# Patient Record
Sex: Male | Born: 1995 | Race: Black or African American | Hispanic: No | Marital: Single | Smoking: Never smoker
Health system: Southern US, Community
[De-identification: ages and names within clinical notes are randomized; demographics above are authoritative.]

## PROBLEM LIST (undated history)

## (undated) DIAGNOSIS — R569 Unspecified convulsions: Secondary | ICD-10-CM

## (undated) DIAGNOSIS — G809 Cerebral palsy, unspecified: Secondary | ICD-10-CM

## (undated) DIAGNOSIS — F909 Attention-deficit hyperactivity disorder, unspecified type: Secondary | ICD-10-CM

## (undated) HISTORY — PX: HIP PINNING: SHX1757

---
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
## 2013-03-18 DIAGNOSIS — R569 Unspecified convulsions: Secondary | ICD-10-CM

## 2016-04-19 DIAGNOSIS — G809 Cerebral palsy, unspecified: Secondary | ICD-10-CM | POA: Insufficient documentation

## 2016-04-28 DIAGNOSIS — D709 Neutropenia, unspecified: Secondary | ICD-10-CM | POA: Insufficient documentation

## 2016-10-19 DIAGNOSIS — Q6589 Other specified congenital deformities of hip: Secondary | ICD-10-CM | POA: Insufficient documentation

## 2017-01-22 ENCOUNTER — Emergency Department (HOSPITAL_COMMUNITY)
Admission: EM | Admit: 2017-01-22 | Discharge: 2017-01-22 | Disposition: A | Discharge status: Home / Self Care | Payer: Medicaid Other | Attending: Emergency Medicine | Admitting: Emergency Medicine

## 2017-01-22 ENCOUNTER — Encounter (HOSPITAL_COMMUNITY): Payer: Self-pay | Admitting: *Deleted

## 2017-01-22 ENCOUNTER — Emergency Department (HOSPITAL_COMMUNITY): Payer: Medicaid Other

## 2017-01-22 DIAGNOSIS — Y929 Unspecified place or not applicable: Secondary | ICD-10-CM | POA: Diagnosis not present

## 2017-01-22 DIAGNOSIS — Y939 Activity, unspecified: Secondary | ICD-10-CM | POA: Diagnosis not present

## 2017-01-22 DIAGNOSIS — W19XXXA Unspecified fall, initial encounter: Secondary | ICD-10-CM | POA: Insufficient documentation

## 2017-01-22 DIAGNOSIS — G809 Cerebral palsy, unspecified: Secondary | ICD-10-CM | POA: Diagnosis not present

## 2017-01-22 DIAGNOSIS — Y999 Unspecified external cause status: Secondary | ICD-10-CM | POA: Diagnosis not present

## 2017-01-22 DIAGNOSIS — R569 Unspecified convulsions: Secondary | ICD-10-CM | POA: Insufficient documentation

## 2017-01-22 DIAGNOSIS — F909 Attention-deficit hyperactivity disorder, unspecified type: Secondary | ICD-10-CM | POA: Insufficient documentation

## 2017-01-22 HISTORY — DX: Cerebral palsy, unspecified: G80.9

## 2017-01-22 HISTORY — DX: Attention-deficit hyperactivity disorder, unspecified type: F90.9

## 2017-01-22 HISTORY — DX: Unspecified convulsions: R56.9

## 2017-01-22 MED ORDER — LEVETIRACETAM 500 MG PO TABS
1000.0000 mg | ORAL_TABLET | Freq: Once | ORAL | Status: AC
  Administered 2017-01-22: 1000 mg via ORAL
  Filled 2017-01-22: qty 2

## 2017-01-22 MED ORDER — OXYCODONE HCL 5 MG PO TABS: 5.0000 mg | ORAL_TABLET | Freq: Four times a day (QID) | ORAL | 0 refills | Status: DC | PRN

## 2017-01-22 NOTE — ED Notes (Signed)
Patient presents to ed via [facility]  Was getting out of the car and had a seizure family states he hit his head on the concrete. No abrasion noted to head or face. Mother is at bedside. And states patient has been taking medicines at he is suppose to. Patient has abrasion left hand. Last seizure was May 17. States he just recently had left hip surgery 4/10. Scheduled for follow up with his neur. Doctor at [facility] 6/21. Patient is a/o .

## 2017-01-22 NOTE — Discharge Instructions (Signed)
Please read and follow all provided instructions.  Your diagnoses today include:  1. Seizure (HCC)     Tests performed today include: Vital signs. See below for your results today.   Medications prescribed:  Take as prescribed   Home care instructions:  Follow any educational materials contained in this packet.  Follow-up instructions: Please follow-up with your Neurologist for further evaluation of symptoms and treatment   Return instructions:  Please return to the Emergency Department if you do not get better, if you get worse, or new symptoms OR  - Fever (temperature greater than 101.55F)  - Bleeding that does not stop with holding pressure to the area    -Severe pain (please note that you may be more sore the day after your accident)  - Chest Pain  - Difficulty breathing  - Severe nausea or vomiting  - Inability to tolerate food and liquids  - Passing out  - Skin becoming red around your wounds  - Change in mental status (confusion or lethargy)  - New numbness or weakness    Please return if you have any other emergent concerns.  Additional Information:  Your vital signs today were: BP (!) 138/53 (BP Location: Right Arm)    Pulse (!) 106    Temp 97.6 F (36.4 C) (Oral)    Resp 18    Ht 5\' 6"  (1.676 m)    Wt 64.9 kg (143 lb)    SpO2 99%    BMI 23.08 kg/m  If your blood pressure (BP) was elevated above 135/85 this visit, please have this repeated by your doctor within one month. ---------------

## 2017-01-22 NOTE — ED Provider Notes (Signed)
[facility]-EMERGENCY DEPT Provider Note   CSN: [id number] Arrival date & time: 01/22/17  1108     History   Chief Complaint Chief Complaint  Patient presents with  . Seizures    HPI Roberto Manning is a 21 y.o. male.  HPI  21 y.o. male with a hx of Seizures, Cerebral Palsy, presents to the Emergency Department today due to seizure PTA around 10a. Noted hx same. Last seizure May 17th. Pt mother states that the seizures was witnessed by sister and lasted <1 minute. Noted tonic-clonic seizure as past. No aura. Postictal afterwards. Baseline currently. No N/V. No headaches. No numbness/tingling. No CP/SOB/ABD pain. No visual changes. No fevers. Pt has follow up with [facility] later this month. Pt currently taking Tegretol. Unsure of dosing. No other symptoms noted.  Past Medical History:  Diagnosis Date  . ADHD   . Cerebral palsy (HCC)   . Seizures (HCC)     There are no active problems to display for this patient.   Past Surgical History:  Procedure Laterality Date  . HIP PINNING         Home Medications    Prior to Admission medications   Not on File    Family History No family history on file.  Social History Social History  Substance Use Topics  . Smoking status: Never Smoker  . Smokeless tobacco: Never Used  . Alcohol use No     Allergies   Patient has no allergy information on record.   Review of Systems Review of Systems ROS reviewed and all are negative for acute change except as noted in the HPI.  Physical Exam Updated Vital Signs BP (!) 138/53 (BP Location: Right Arm)   Pulse (!) 106   Temp 97.6 F (36.4 C) (Oral)   Resp 18   Ht 5\' 6"  (1.676 m)   Wt 64.9 kg (143 lb)   SpO2 99%   BMI 23.08 kg/m   Physical Exam  Constitutional: He is oriented to person, place, and time. Vital signs are normal. He appears well-developed and well-nourished.  HENT:  Head: Normocephalic and atraumatic.  Right Ear: Hearing normal.  Left Ear: Hearing  normal.  Eyes: Conjunctivae and EOM are normal. Pupils are equal, round, and reactive to light.  Neck: Normal range of motion. Neck supple.  Cardiovascular: Normal rate, regular rhythm, normal heart sounds and intact distal pulses.   Pulmonary/Chest: Effort normal and breath sounds normal.  Abdominal: There is no tenderness.  Musculoskeletal: Normal range of motion.  Left Hip ROM intact. Non TTP. NVI. Distal pulses appreciated. No palpable or visible deformities  Neurological: He is alert and oriented to person, place, and time. He has normal strength. No cranial nerve deficit or sensory deficit.  Cranial Nerves:  II: Pupils equal, round, reactive to light III,IV, VI: ptosis not present, extra-ocular motions intact bilaterally  V,VII: smile symmetric, facial light touch sensation equal VIII: hearing grossly normal bilaterally  IX,X: midline uvula rise  XI: bilateral shoulder shrug equal and strong XII: midline tongue extension  Skin: Skin is warm and dry.  Psychiatric: He has a normal mood and affect. His speech is normal and behavior is normal. Thought content normal.  Nursing note and vitals reviewed.  ED Treatments / Results  Labs (all labs ordered are listed, but only abnormal results are displayed) Labs Reviewed - No data to display  EKG  EKG Interpretation None       Radiology Ct Head Wo Contrast  Result Date: 01/22/2017  CLINICAL DATA:  Seizure with fall EXAM: CT HEAD WITHOUT CONTRAST TECHNIQUE: Contiguous axial images were obtained from the base of the skull through the vertex without intravenous contrast. COMPARISON:  None. FINDINGS: Brain: The ventricles are upper normal in size for age. Sulci appear normal. There is no intracranial mass, hemorrhage, extra-axial fluid collection, or midline shift. The gray-white compartments appear normal. No acute infarct evident. Vascular: There is no hyperdense vessel. No vascular calcification evident. Skull: The bony calvarium  appears intact. Sinuses/Orbits: There is opacification in both maxillary antra, more severe on the left than on the right. Opacification of multiple ethmoid air cells bilaterally. There are retention cysts in the left sphenoid sinus region. Frontal sinuses are hypoplastic. Visualized orbits appear symmetric bilaterally. Other: Mastoid air cells are clear. IMPRESSION: Multifocal paranasal sinus disease. Ventricles upper normal in size. No intracranial mass, hemorrhage, or extra-axial fluid collection. Gray-white compartments appear normal. Electronically Signed   By: Bretta BangWilliam  Woodruff III M.D.   On: 01/22/2017 13:38    Procedures Procedures (including critical care time)  Medications Ordered in ED Medications - No data to display   Initial Impression / Assessment and Plan / ED Course  I have reviewed the triage vital signs and the nursing notes.  Pertinent labs & imaging results that were available during my care of the patient were reviewed by me and considered in my medical decision making (see chart for details).  Final Clinical Impressions(s) / ED Diagnoses   {I have reviewed and evaluated the relevant imaging studies.  {I have reviewed the relevant previous healthcare records.  {I obtained HPI from historian. {Patient discussed with supervising physician.  ED Course:  Assessment: Pt is a 21 y.o. male with hx Seizures, Cerebral Palsy who presents with seizure PTA. Hx same. Last seizure May 17th. [facility] with appointment this month. Seizure <1 min. Witnessed. Tonic-clonic. On exam, pt in NAD. Nontoxic/nonseptic appearing. VSS. Afebrile. Lungs CTA. Heart RRR. Baseline per mother currently. Pt mother requested head CT, which was unremarkable for acute abnormalities. Given Keppra loading dose in ED. Pt also complaitned of pain to left hip. Noted surgery recently. Able to bear weight. Exam unremarkable. I have reviewed the [place] Controlled Substance Reporting System. Will Rx  #7 Oxycodone for breakthrough pain. Continue Motrin/Tylenol.  Plan is to DC home with follow up to Neurology as outpatient. At time of discharge, Patient is in no acute distress. Vital Signs are stable. Patient is able to ambulate. Patient able to tolerate PO.   Disposition/Plan:  DC Home Additional Verbal discharge instructions given and discussed with patient.  Pt Instructed to f/u with Neurology in the next week for evaluation and treatment of symptoms. Return precautions given Pt acknowledges and agrees with plan  Supervising Physician Shaune PollackIsaacs, Cameron, MD  Final diagnoses:  Seizure [facility](HCC)    New Prescriptions New Prescriptions   No medications on file       Audry PiliMohr, Can Lucci, Cordelia Poche-C 01/22/17 1452    Shaune PollackIsaacs, Cameron, MD 01/23/17 1324    Shaune PollackIsaacs, Cameron, MD 01/23/17 1325

## 2017-06-06 ENCOUNTER — Observation Stay (HOSPITAL_COMMUNITY)
Admission: EM | Admit: 2017-06-06 | Discharge: 2017-06-07 | Disposition: A | Discharge status: Home / Self Care | Payer: Medicaid Other | Attending: Internal Medicine | Admitting: Internal Medicine

## 2017-06-06 ENCOUNTER — Emergency Department (HOSPITAL_COMMUNITY): Payer: Medicaid Other

## 2017-06-06 ENCOUNTER — Encounter (HOSPITAL_COMMUNITY): Payer: Self-pay | Admitting: Internal Medicine

## 2017-06-06 DIAGNOSIS — Q6589 Other specified congenital deformities of hip: Secondary | ICD-10-CM | POA: Insufficient documentation

## 2017-06-06 DIAGNOSIS — F909 Attention-deficit hyperactivity disorder, unspecified type: Secondary | ICD-10-CM | POA: Diagnosis not present

## 2017-06-06 DIAGNOSIS — M4802 Spinal stenosis, cervical region: Secondary | ICD-10-CM | POA: Diagnosis not present

## 2017-06-06 DIAGNOSIS — Z79899 Other long term (current) drug therapy: Secondary | ICD-10-CM | POA: Diagnosis not present

## 2017-06-06 DIAGNOSIS — M50822 Other cervical disc disorders at C5-C6 level: Secondary | ICD-10-CM | POA: Insufficient documentation

## 2017-06-06 DIAGNOSIS — R569 Unspecified convulsions: Principal | ICD-10-CM

## 2017-06-06 DIAGNOSIS — G809 Cerebral palsy, unspecified: Secondary | ICD-10-CM | POA: Diagnosis not present

## 2017-06-06 DIAGNOSIS — R937 Abnormal findings on diagnostic imaging of other parts of musculoskeletal system: Secondary | ICD-10-CM

## 2017-06-06 LAB — BASIC METABOLIC PANEL
Anion gap: 11 (ref 5–15)
BUN: 16 mg/dL (ref 6–20)
CO2: 25 mmol/L (ref 22–32)
Calcium: 9.5 mg/dL (ref 8.9–10.3)
Chloride: 106 mmol/L (ref 101–111)
Creatinine, Ser: 0.79 mg/dL (ref 0.61–1.24)
GFR calc Af Amer: >60 mL/min (ref >60)
GFR calc non Af Amer: >60 mL/min (ref >60)
Glucose, Bld: 90 mg/dL (ref 65–99)
POTASSIUM: 4.2 mmol/L (ref 3.5–5.1)
Sodium: 142 mmol/L (ref 135–145)

## 2017-06-06 LAB — CBC WITH DIFFERENTIAL/PLATELET
Basophils Absolute: 0 10*3/uL (ref 0.0–0.1)
Basophils Relative: 0 %
EOS ABS: 0.2 10*3/uL (ref 0.0–0.7)
Eosinophils Relative: 4 %
HCT: 43.3 % (ref 39.0–52.0)
HEMOGLOBIN: 14.5 g/dL (ref 13.0–17.0)
LYMPHS ABS: 1.5 10*3/uL (ref 0.7–4.0)
LYMPHS PCT: 33 %
MCH: 31.7 pg (ref 26.0–34.0)
MCHC: 33.5 g/dL (ref 30.0–36.0)
MCV: 94.5 fL (ref 78.0–100.0)
MONO ABS: 0.5 10*3/uL (ref 0.1–1.0)
MONOS PCT: 11 %
NEUTROS ABS: 2.3 10*3/uL (ref 1.7–7.7)
Neutrophils Relative %: 52 %
PLATELETS: 204 10*3/uL (ref 150–400)
RBC: 4.58 MIL/uL (ref 4.22–5.81)
RDW: 14.7 % (ref 11.5–15.5)
WBC: 4.4 10*3/uL (ref 4.0–10.5)

## 2017-06-06 LAB — CBG MONITORING, ED: Glucose-Capillary: 88 mg/dL (ref 65–99)

## 2017-06-06 LAB — CARBAMAZEPINE LEVEL, TOTAL

## 2017-06-06 MED ORDER — LORAZEPAM 2 MG/ML IJ SOLN: 4.0000 mg | INTRAMUSCULAR | Status: AC

## 2017-06-06 MED ORDER — SODIUM CHLORIDE 0.9 % IV SOLN
500.0000 mg | Freq: Two times a day (BID) | INTRAVENOUS | Status: DC
  Administered 2017-06-07: 500 mg via INTRAVENOUS
  Filled 2017-06-06 (×2): qty 5

## 2017-06-06 MED ORDER — SODIUM CHLORIDE 0.9 % IV SOLN
75.0000 mL/h | INTRAVENOUS | Status: DC
  Administered 2017-06-06 – 2017-06-07 (×2): 75 mL/h via INTRAVENOUS

## 2017-06-06 MED ORDER — LORAZEPAM 2 MG/ML IJ SOLN
1.0000 mg | Freq: Once | INTRAMUSCULAR | Status: AC
  Administered 2017-06-06: 1 mg via INTRAVENOUS

## 2017-06-06 MED ORDER — LORAZEPAM 2 MG/ML IJ SOLN
INTRAMUSCULAR | Status: AC
Start: 2017-06-06 — End: 2017-06-06
  Administered 2017-06-06: 1 mg via INTRAVENOUS
  Filled 2017-06-06: qty 1

## 2017-06-06 MED ORDER — ACETAMINOPHEN 325 MG PO TABS
650.0000 mg | ORAL_TABLET | ORAL | Status: DC | PRN
  Administered 2017-06-07: 650 mg via ORAL
  Filled 2017-06-06: qty 2

## 2017-06-06 MED ORDER — DICLOFENAC SODIUM 75 MG PO TBEC
75.0000 mg | DELAYED_RELEASE_TABLET | Freq: Two times a day (BID) | ORAL | Status: DC
  Filled 2017-06-06: qty 1

## 2017-06-06 MED ORDER — SODIUM CHLORIDE 0.9 % IV SOLN
1000.0000 mg | Freq: Once | INTRAVENOUS | Status: AC
  Administered 2017-06-06: 1000 mg via INTRAVENOUS
  Filled 2017-06-06: qty 10

## 2017-06-06 MED ORDER — METHYLPHENIDATE HCL ER 18 MG PO TB24
36.0000 mg | ORAL_TABLET | Freq: Every morning | ORAL | Status: DC
  Administered 2017-06-07: 36 mg via ORAL
  Filled 2017-06-06: qty 2

## 2017-06-06 MED ORDER — CARBAMAZEPINE ER 200 MG PO TB12
200.0000 mg | ORAL_TABLET | Freq: Two times a day (BID) | ORAL | Status: DC
  Administered 2017-06-06 – 2017-06-07 (×2): 200 mg via ORAL
  Filled 2017-06-06 (×2): qty 1

## 2017-06-06 MED ORDER — DICLOFENAC-MISOPROSTOL 75-0.2 MG PO TBEC: 1.0000 | DELAYED_RELEASE_TABLET | Freq: Two times a day (BID) | ORAL | Status: DC

## 2017-06-06 MED ORDER — ONDANSETRON HCL 4 MG/2ML IJ SOLN: 4.0000 mg | Freq: Four times a day (QID) | INTRAMUSCULAR | Status: DC | PRN

## 2017-06-06 MED ORDER — MISOPROSTOL 25 MCG QUARTER TABLET
25.0000 ug | ORAL_TABLET | Freq: Two times a day (BID) | ORAL | Status: DC
  Filled 2017-06-06: qty 1

## 2017-06-06 MED ORDER — ACETAMINOPHEN 650 MG RE SUPP: 650.0000 mg | RECTAL | Status: DC | PRN

## 2017-06-06 NOTE — ED Notes (Signed)
Please call report to RN, Belma at 2130 at [phone]. Thank you!

## 2017-06-06 NOTE — ED Notes (Signed)
[facility] at this time. Dr. Selena BattenKim paged and notified of pt's transfer to 1433.

## 2017-06-06 NOTE — H&P (Addendum)
TRH H&P   Patient Demographics:    Roberto Manning, is a 21 y.o. male  MRN: [id number]   DOB - 04-04-1996  Admit Date - 06/06/2017  Outpatient Primary MD for the patient is Patient, No Pcp Per  Referring MD/NP/PA: Gray Bernhardt  Outpatient Specialists:  [facility]  Patient coming from: library  Chief Complaint  Patient presents with  . Seizures      HPI:    Roberto Manning  is a 21 y.o. male, w seizure do apparently was at [employer] when experienced a witnessed tonic clonic seizure.  His mother helped him to the ground. Uncertain duration. Might have bit tongue. Last seizure in 01/2017.  Pt might have missed 1 dose tegretol per mother.   Pt was brought to ED for evaluation.    While in ED, he had another seizure briefly, generalized. Pt was loaded with Keppra 1000mg  iv x1 and given ativan as well.  Pt has not had any further seizure.   CT brain negative MRI C spine IMPRESSION: 1. No acute ligamentous injury or epidural collection. 2. Small right subarticular disc protrusion at C5-6 with mild right neural foraminal stenosis.   I briefly spoke with neurology who recommended that we could continue w keppra and increase his tegretol.  I called pharmacy to see if there was an oral loading protocal and they could not find one.  Neurology recommended once tegretol therapeutic that he could follow up with outpatient neurology.        Review of systems:    In addition to the HPI above, No Fever-chills, No Headache, No changes with Vision or hearing, No problems swallowing food or Liquids, No Chest pain, Cough or Shortness of Breath, No Abdominal pain, No Nausea or Vommitting, Bowel movements are regular, No Blood in stool or Urine, No dysuria, No new skin rashes or bruises, No new joints pains-aches,  No new weakness, tingling, numbness in any extremity, No recent  weight gain or loss, No polyuria, polydypsia or polyphagia, No significant Mental Stressors.  A full 10 point Review of Systems was done, except as stated above, all other Review of Systems were negative.   With Past History of the following :    Past Medical History:  Diagnosis Date  . ADHD   . Cerebral palsy (HCC)   . Seizures (HCC)       Past Surgical History:  Procedure Laterality Date  . HIP PINNING        Social History:     Social History  Substance Use Topics  . Smoking status: Never Smoker  . Smokeless tobacco: Never Used  . Alcohol use No     Lives - at home  Mobility -  Walks by self   Family History :    History reviewed. No pertinent family history. No family hx of seizure.    Home Medications:  Prior to Admission medications   Medication Sig Start Date End Date Taking? Authorizing Provider  acetaminophen (TYLENOL) 325 MG tablet Take 650 mg by mouth daily as needed for mild pain.   Yes [provider]  Diclofenac-Misoprostol 75-0.2 MG TBEC Take 1 tablet by mouth 2 (two) times daily. 03/27/17  Yes [provider]  methylphenidate 36 MG PO CR tablet Take 36 mg by mouth every morning.   Yes [provider]  TEGRETOL-XR 200 MG 12 hr tablet Take 200 mg by mouth every evening. 05/09/17  Yes [provider]  oxyCODONE (ROXICODONE) 5 MG immediate release tablet Take 1 tablet (5 mg total) by mouth every 6 (six) hours as needed for breakthrough pain. Patient not taking: Reported on 06/06/2017 01/22/17   Audry Pili, PA-C     Allergies:    No Known Allergies nkda    Physical Exam:   Vitals  Blood pressure 117/71, pulse 76, temperature 97.6 F (36.4 C), temperature source Oral, resp. rate 16, height 5\' 5"  (1.651 m), weight 76.2 kg (167 lb 15.9 oz), SpO2 100 %.   1. General  lying in bed in NAD,   2. Normal affect and insight, Not Suicidal or Homicidal, Awake Alert, Oriented X 3.  3. No F.N deficits, ALL C.Nerves  Intact, Strength 5/5 all 4 extremities, Sensation intact all 4 extremities, Plantars down going.  4. Ears and Eyes appear Normal, Conjunctivae clear, PERRLA. Moist Oral Mucosa.  5. Supple Neck, No JVD, No cervical lymphadenopathy appriciated, No Carotid Bruits.  6. Symmetrical Chest wall movement, Good air movement bilaterally, CTAB.  7. RRR, No Gallops, Rubs or Murmurs, No Parasternal Heave.  8. Positive Bowel Sounds, Abdomen Soft, No tenderness, No organomegaly appriciated,No rebound -guarding or rigidity.  9.  No Cyanosis, Normal Skin Turgor, No Skin Rash or Bruise.  10. Good muscle tone,  joints appear normal , no effusions, Normal ROM.  11. No Palpable Lymph Nodes in Neck or Axillae     Data Review:    CBC  Recent Labs Lab 06/06/17 1535  WBC 4.4  HGB 14.5  HCT 43.3  PLT 204  MCV 94.5  MCH 31.7  MCHC 33.5  RDW 14.7  LYMPHSABS 1.5  MONOABS 0.5  EOSABS 0.2  BASOSABS 0.0   ------------------------------------------------------------------------------------------------------------------  Chemistries   Recent Labs Lab 06/06/17 1535  NA 142  K 4.2  CL 106  CO2 25  GLUCOSE 90  BUN 16  CREATININE 0.79  CALCIUM 9.5   ------------------------------------------------------------------------------------------------------------------ estimated creatinine clearance is 136.9 mL/min (by C-G formula based on SCr of 0.79 mg/dL). ------------------------------------------------------------------------------------------------------------------ No results for input(s): TSH, T4TOTAL, T3FREE, THYROIDAB in the last 72 hours.  Invalid input(s): FREET3  Coagulation profile No results for input(s): INR, PROTIME in the last 168 hours. ------------------------------------------------------------------------------------------------------------------- No results for input(s): DDIMER in the last 72  hours. -------------------------------------------------------------------------------------------------------------------  Cardiac Enzymes No results for input(s): CKMB, TROPONINI, MYOGLOBIN in the last 168 hours.  Invalid input(s): CK ------------------------------------------------------------------------------------------------------------------ No results found for: BNP   ---------------------------------------------------------------------------------------------------------------  Urinalysis No results found for: COLORURINE, APPEARANCEUR, LABSPEC, PHURINE, GLUCOSEU, HGBUR, BILIRUBINUR, KETONESUR, PROTEINUR, UROBILINOGEN, NITRITE, LEUKOCYTESUR  ----------------------------------------------------------------------------------------------------------------   Imaging Results:    Ct Head Wo Contrast  Result Date: 06/06/2017 CLINICAL DATA:  Seizures when. EXAM: CT HEAD WITHOUT CONTRAST CT CERVICAL SPINE WITHOUT CONTRAST TECHNIQUE: Multidetector CT imaging of the head and cervical spine was performed following the standard protocol without intravenous contrast. Multiplanar CT image reconstructions of the cervical spine were also generated. COMPARISON:  CT head dated  January 22, 2017. FINDINGS: CT HEAD FINDINGS Brain: No evidence of acute infarction, hemorrhage, hydrocephalus, extra-axial collection or mass lesion/mass effect. Vascular: No hyperdense vessel or unexpected calcification. Skull: Normal. Negative for fracture or focal lesion. Sinuses/Orbits: No acute finding. Other: None. CT CERVICAL SPINE FINDINGS Alignment: Reversal of the normal cervical lordosis centered at C5. No traumatic malalignment. Skull base and vertebrae: No acute fracture. No primary bone lesion or focal pathologic process. Soft tissues and spinal canal: Prevertebral soft tissue thickness is at the upper limits of normal, measuring 7 mm. Slightly prominent posterior longitudinal ligament from C1-C3. Disc levels:  Normal.  Upper chest: Negative. Other: None. IMPRESSION: 1.  No acute intracranial abnormality. 2.  No acute cervical spine fracture. 3. Slightly prominent posterior longitudinal ligament from C1-C3. While this may represent hypertrophy, given the borderline thickened prevertebral soft tissues, cervical spine MRI is recommended to exclude ligamentous injury. Electronically Signed   By: Obie Dredge M.D.   On: 06/06/2017 19:19   Ct Cervical Spine Wo Contrast  Result Date: 06/06/2017 CLINICAL DATA:  Seizures when. EXAM: CT HEAD WITHOUT CONTRAST CT CERVICAL SPINE WITHOUT CONTRAST TECHNIQUE: Multidetector CT imaging of the head and cervical spine was performed following the standard protocol without intravenous contrast. Multiplanar CT image reconstructions of the cervical spine were also generated. COMPARISON:  CT head dated January 22, 2017. FINDINGS: CT HEAD FINDINGS Brain: No evidence of acute infarction, hemorrhage, hydrocephalus, extra-axial collection or mass lesion/mass effect. Vascular: No hyperdense vessel or unexpected calcification. Skull: Normal. Negative for fracture or focal lesion. Sinuses/Orbits: No acute finding. Other: None. CT CERVICAL SPINE FINDINGS Alignment: Reversal of the normal cervical lordosis centered at C5. No traumatic malalignment. Skull base and vertebrae: No acute fracture. No primary bone lesion or focal pathologic process. Soft tissues and spinal canal: Prevertebral soft tissue thickness is at the upper limits of normal, measuring 7 mm. Slightly prominent posterior longitudinal ligament from C1-C3. Disc levels:  Normal. Upper chest: Negative. Other: None. IMPRESSION: 1.  No acute intracranial abnormality. 2.  No acute cervical spine fracture. 3. Slightly prominent posterior longitudinal ligament from C1-C3. While this may represent hypertrophy, given the borderline thickened prevertebral soft tissues, cervical spine MRI is recommended to exclude ligamentous injury. Electronically Signed    By: Obie Dredge M.D.   On: 06/06/2017 19:19   Mr Cervical Spine Wo Contrast  Result Date: 06/06/2017 CLINICAL DATA:  Seizure. Abnormality of the posterior longitudinal ligament on cervical spine CT earlier today. EXAM: MRI CERVICAL SPINE WITHOUT CONTRAST TECHNIQUE: Multiplanar, multisequence MR imaging of the cervical spine was performed. No intravenous contrast was administered. COMPARISON:  None. FINDINGS: Alignment: Physiologic. Vertebrae/ligaments: No fracture, evidence of discitis, or bone lesion. No evidence of acute ligamentous injury. The abnormality along the ventral spinal canal at C2-C4 is not confirmed by MRI. No epidural hematoma. Cord: Normal signal and morphology. Posterior Fossa, vertebral arteries, paraspinal tissues: Negative. Disc levels: C1-C2: Normal. C2-C3: Normal disc space and facets. No spinal canal or neuroforaminal stenosis. C3-C4: Normal disc space and facets. No spinal canal or neuroforaminal stenosis. C4-C5: Normal disc space and facets. No spinal canal or neuroforaminal stenosis. C5-C6: Small right subarticular disc protrusion causes mild right neural foraminal stenosis. C6-C7: Normal disc space and facets. No spinal canal or neuroforaminal stenosis. C7-T1: Normal disc space and facets. No spinal canal or neuroforaminal stenosis. IMPRESSION: 1. No acute ligamentous injury or epidural collection. 2. Small right subarticular disc protrusion at C5-6 with mild right neural foraminal stenosis. Electronically Signed   By: Caryn Bee  Chase PicketHerman M.D.   On: 06/06/2017 22:04      Assessment & Plan:    Principal Problem:   Seizures (HCC)    Seizure do keppra 500mg  iv bid Increase tegretol to 200mg  po bid Check tegretol level  In am MRI brain EEG Neurology curbsided, recommended increasing tegretol and if level therapeutic can go home with outpatient follow up.    ADD Cont methylphenidate       DVT Prophylaxis  SCDs   AM Labs Ordered, also please review Full  Orders  Family Communication: Admission, patients condition and plan of care including tests being ordered have been discussed with the patient  who indicate understanding and agree with the plan and Code Status.  Code Status FULL CODE  Likely DC to  home  Condition GUARDED    Consults called: neurology curbsided  Admission status: observation  Time spent in minutes : 45   Pearson GrippeJames Cataldo Cosgriff M.D on 06/06/2017 at 10:41 PM  Between 7am to 7pm - Pager - [phone]. After 7pm go to [email or web address] - password [email or web address]  [facility] - Office  [phone]

## 2017-06-06 NOTE — ED Notes (Signed)
Patient ambulated to restroom with mother and EMT.  Patient's mother yelled out from bathroom stating patient having seizure. Mother was with patient and able to lower him to ground when seizure started.   Patient assisted onto ED stretcher from ground and back to treatment roomby ED staff. Dr Effie ShyWentz made aware. New orders to be placed.

## 2017-06-06 NOTE — ED Notes (Signed)
Patient transported to CT 

## 2017-06-06 NOTE — ED Notes (Signed)
Patient transported to MRI 

## 2017-06-06 NOTE — ED Provider Notes (Addendum)
[facility]-EMERGENCY DEPT Provider Note   CSN: [id number] Arrival date & time: 06/06/17  1454     History   Chief Complaint Chief Complaint  Patient presents with  . Seizures    HPI Roberto Manning is a 21 y.o. male.  He presents for evaluation of a seizure.  He cannot remember what happened.  Last seizure, several months ago at which time he was seen in the emergency department.  He has since followed up with his neurologist.  He currently lives in [place].  His neurologist is in [facility].  He denies recent illnesses including fever, chills, vomiting, weakness or dizziness.  He denies headache, arm pain, chest pain, mouth tongue or jaw pain, neck pain or weakness.  There are no other known modifying factors.  HPI  Past Medical History:  Diagnosis Date  . ADHD   . Cerebral palsy (HCC)   . Seizures (HCC)     There are no active problems to display for this patient.   Past Surgical History:  Procedure Laterality Date  . HIP PINNING         Home Medications    Prior to Admission medications   Medication Sig Start Date End Date Taking? Authorizing Provider  acetaminophen (TYLENOL) 325 MG tablet Take 650 mg by mouth daily as needed for mild pain.   Yes [provider]  Diclofenac-Misoprostol 75-0.2 MG TBEC Take 1 tablet by mouth 2 (two) times daily. 03/27/17  Yes [provider]  methylphenidate 36 MG PO CR tablet Take 36 mg by mouth every morning.   Yes [provider]  TEGRETOL-XR 200 MG 12 hr tablet Take 200 mg by mouth every evening. 05/09/17  Yes [provider]  oxyCODONE (ROXICODONE) 5 MG immediate release tablet Take 1 tablet (5 mg total) by mouth every 6 (six) hours as needed for breakthrough pain. Patient not taking: Reported on 06/06/2017 01/22/17   Audry Pili, PA-C    Family History History reviewed. No pertinent family history.  Social History Social History    Substance Use Topics  . Smoking status: Never Smoker  . Smokeless tobacco: Never Used  . Alcohol use No     Allergies   Patient has no allergy information on record.   Review of Systems Review of Systems  All other systems reviewed and are negative.    Physical Exam Updated Vital Signs BP (!) 111/51   Pulse 81   Temp 98.3 F (36.8 C) (Oral)   Resp 18   Ht 5\' 5"  (1.651 m)   Wt 64.9 kg (143 lb)   SpO2 98%   BMI 23.80 kg/m   Physical Exam  Constitutional: He is oriented to person, place, and time. He appears well-developed and well-nourished.  HENT:  Head: Normocephalic and atraumatic.  Right Ear: External ear normal.  Left Ear: External ear normal.  No lingual trauma.  No trismus.  Eyes: Pupils are equal, round, and reactive to light. Conjunctivae and EOM are normal.  Neck: Normal range of motion and phonation normal. Neck supple.  Cardiovascular: Normal rate, regular rhythm and normal heart sounds.   Pulmonary/Chest: Effort normal and breath sounds normal. He exhibits no bony tenderness.  Abdominal: Soft. There is no tenderness.  Musculoskeletal: Normal range of motion. He exhibits no tenderness or deformity.  Normal range of motion all large joints.  Mild spasticity of the left arm and left leg.  Neurological: He is alert and oriented to person, place, and time.  No cranial nerve deficit or sensory deficit. He exhibits normal muscle tone. Coordination normal.  Skin: Skin is warm, dry and intact.  Psychiatric: He has a normal mood and affect. His behavior is normal.  Nursing note and vitals reviewed.    ED Treatments / Results  Labs (all labs ordered are listed, but only abnormal results are displayed) Labs Reviewed  BASIC METABOLIC PANEL  CBC WITH DIFFERENTIAL/PLATELET  CARBAMAZEPINE LEVEL, TOTAL  CBG MONITORING, ED    EKG  EKG Interpretation  Date/Time:  Thursday June 06 2017 15:03:13 EDT Ventricular Rate:  102 PR Interval:    QRS  Duration: 92 QT Interval:  335 QTC Calculation: 437 R Axis:   -13 Text Interpretation:  since last tracing no significant change Sinus tachycardia Left ventricular hypertrophy Confirmed by Mancel BaleWentz, Radie Berges [phone]) on 06/06/2017 3:12:40 PM       Radiology Ct Head Wo Contrast  Result Date: 06/06/2017 CLINICAL DATA:  Seizures when. EXAM: CT HEAD WITHOUT CONTRAST CT CERVICAL SPINE WITHOUT CONTRAST TECHNIQUE: Multidetector CT imaging of the head and cervical spine was performed following the standard protocol without intravenous contrast. Multiplanar CT image reconstructions of the cervical spine were also generated. COMPARISON:  CT head dated January 22, 2017. FINDINGS: CT HEAD FINDINGS Brain: No evidence of acute infarction, hemorrhage, hydrocephalus, extra-axial collection or mass lesion/mass effect. Vascular: No hyperdense vessel or unexpected calcification. Skull: Normal. Negative for fracture or focal lesion. Sinuses/Orbits: No acute finding. Other: None. CT CERVICAL SPINE FINDINGS Alignment: Reversal of the normal cervical lordosis centered at C5. No traumatic malalignment. Skull base and vertebrae: No acute fracture. No primary bone lesion or focal pathologic process. Soft tissues and spinal canal: Prevertebral soft tissue thickness is at the upper limits of normal, measuring 7 mm. Slightly prominent posterior longitudinal ligament from C1-C3. Disc levels:  Normal. Upper chest: Negative. Other: None. IMPRESSION: 1.  No acute intracranial abnormality. 2.  No acute cervical spine fracture. 3. Slightly prominent posterior longitudinal ligament from C1-C3. While this may represent hypertrophy, given the borderline thickened prevertebral soft tissues, cervical spine MRI is recommended to exclude ligamentous injury. Electronically Signed   By: Obie DredgeWilliam T Derry M.D.   On: 06/06/2017 19:19   Ct Cervical Spine Wo Contrast  Result Date: 06/06/2017 CLINICAL DATA:  Seizures when. EXAM: CT HEAD WITHOUT CONTRAST CT  CERVICAL SPINE WITHOUT CONTRAST TECHNIQUE: Multidetector CT imaging of the head and cervical spine was performed following the standard protocol without intravenous contrast. Multiplanar CT image reconstructions of the cervical spine were also generated. COMPARISON:  CT head dated January 22, 2017. FINDINGS: CT HEAD FINDINGS Brain: No evidence of acute infarction, hemorrhage, hydrocephalus, extra-axial collection or mass lesion/mass effect. Vascular: No hyperdense vessel or unexpected calcification. Skull: Normal. Negative for fracture or focal lesion. Sinuses/Orbits: No acute finding. Other: None. CT CERVICAL SPINE FINDINGS Alignment: Reversal of the normal cervical lordosis centered at C5. No traumatic malalignment. Skull base and vertebrae: No acute fracture. No primary bone lesion or focal pathologic process. Soft tissues and spinal canal: Prevertebral soft tissue thickness is at the upper limits of normal, measuring 7 mm. Slightly prominent posterior longitudinal ligament from C1-C3. Disc levels:  Normal. Upper chest: Negative. Other: None. IMPRESSION: 1.  No acute intracranial abnormality. 2.  No acute cervical spine fracture. 3. Slightly prominent posterior longitudinal ligament from C1-C3. While this may represent hypertrophy, given the borderline thickened prevertebral soft tissues, cervical spine MRI is recommended to exclude ligamentous injury. Electronically Signed   By: Obie DredgeWilliam T Derry M.D.   On:  06/06/2017 19:19    Procedures .Critical Care Performed by: Mancel Bale, MD Authorized by: Mancel Bale, MD   Critical care provider statement:    Critical care time (minutes):  55   Critical care start time:  06/06/2017 2:58 PM   Critical care end time:  06/06/2017 8:55 PM   Critical care time was exclusive of:  Separately billable procedures and treating other patients   Critical care was necessary to treat or prevent imminent or life-threatening deterioration of the following conditions:  Seizure.   Critical care was time spent personally by me on the following activities:  Blood draw for specimens, development of treatment plan with patient or surrogate, discussions with consultants, evaluation of patient's response to treatment, examination of patient, obtaining history from patient or surrogate, ordering and performing treatments and interventions, ordering and review of laboratory studies, ordering and review of radiographic studies, pulse oximetry, re-evaluation of patient's condition and review of old charts   I assumed direction of critical care for this patient from another provider in my specialty: no       (including critical care time)  Medications Ordered in ED Medications  levETIRAcetam (KEPPRA) 1,000 mg in sodium chloride 0.9 % 100 mL IVPB (0 mg Intravenous Stopped 06/06/17 1918)  LORazepam (ATIVAN) injection 1 mg (1 mg Intravenous Given 06/06/17 1814)     Initial Impression / Assessment and Plan / ED Course  I have reviewed the triage vital signs and the nursing notes.  Pertinent labs & imaging results that were available during my care of the patient were reviewed by me and considered in my medical decision making (see chart for details).  Clinical Course as of Jun 06 2049  Thu Jun 06, 2017  1617 Patient's mother is now here and is unable to give additional history.  She was with him when he had the seizure, which occurred as he was walking out of [employer].  She feels like he started shaking on the left side, then had generalized shaking, for 45 minutes.  After that he was unconscious for 15 more minutes, during which time the ambulance arrived.  She feels he may be taking his Tegretol medication, inappropriately, either skipping doses, or losing them.  He has not been otherwise ill or had any other problems recently.  [EW]  1625 The EMS run sheet has been reviewed, they found him awake and alert, within 6 minutes of receiving the call.  Patient's mother  states the EMS call was initiated within a few minutes of his collapse and shaking.  Therefore estimated down time, until recovery of sensorium, is less than 10 minutes.  [EW]  1809 Patient went to the bathroom, with his mother, and he began to have a seizure.  Patient having tonic-clonic seizure at this time, supportive care being given by staff.  Tegretol level still pending, will give Ativan, and Keppra, at this time.  [EW]  1809 Seizure has stopped, patient being extricated, from bathroom, and will take back to his examination room.  [EW]  51 Mother updated on findings and plan.  Mother was with the patient in the bathroom, he was walking on his own, when he suddenly had a seizure causing him to fall to the floor.  Mother is unsure if he had his head, or had other injuries.  Imaging ordered, for possible injury to head neck associated with the seizure, in the ED.  [EW]    Clinical Course User Index [EW] Mancel Bale, MD  Patient Vitals for the past 24 hrs:  BP Temp Temp src Pulse Resp SpO2 Height Weight  06/06/17 1950 (!) 111/51 - - 81 18 98 % - -  06/06/17 1930 (!) 111/51 - - 85 17 - - -  06/06/17 1900 (!) 117/51 - - (!) 101 (!) 21 - - -  06/06/17 1830 (!) 120/51 - - - 19 - - -  06/06/17 1800 138/85 - - 88 19 96 % - -  06/06/17 1730 118/81 - - 83 20 - - -  06/06/17 1700 114/73 - - 77 13 - - -  06/06/17 1630 120/70 - - 82 17 - - -  06/06/17 1600 117/74 - - 83 17 - - -  06/06/17 1530 125/69 - - (!) 105 16 - - -  06/06/17 1508 119/68 98.3 F (36.8 C) Oral (!) 103 18 95 % - -  06/06/17 1503 - - - - - - 5\' 5"  (1.651 m) 64.9 kg (143 lb)  06/06/17 1459 - - - - - 95 % - -    8:51 PM Reevaluation with update and discussion. After initial assessment and treatment, an updated evaluation reveals he is now alert.  No additional seizures, after seizure in the ED.  At this time he is able to open his mouth fully, there is no visible injury to the tongue.  He is cooperative.  He is hungry.   Tegretol level, currently pending.  Patient and mother updated on findings and plan. Geanine Vandekamp L    8:54 PM-Consult complete with hospitalist. Patient case explained and discussed. He agrees to admit patient for further evaluation and treatment. Call ended at 2100  Final Clinical Impressions(s) / ED Diagnoses   Final diagnoses:  Seizure (HCC)  Abnormal x-ray of cervical spine   Seizure, x2, possibly noncompliant with medications.  IV Keppra ordered, pending Tegretol level return.  He may require additional evaluation by neurology with consideration for changing medications, numbers of seizure, no longer partial.  No other evident source for cause of seizure.  Nursing Notes Reviewed/ Care Coordinated Applicable Imaging Reviewed Interpretation of Laboratory Data incorporated into ED treatment  Plan: Admit  New Prescriptions New Prescriptions   No medications on file     Mancel Bale, MD 06/06/17 2103    Mancel Bale, MD 06/24/17 1003

## 2017-06-06 NOTE — ED Notes (Signed)
Bed: [id number] Expected date:  Expected time: 2:53 PM Means of arrival:  Comments: EMS- 21yo M, seizure/ Hx of CP

## 2017-06-06 NOTE — ED Triage Notes (Addendum)
[facility] after having a seizure witnessed by mother. Pt did not fall. Pt takes tegredol regularly and has a hx of seizures and cerebral palsy. Per [facility] seizure lasted 1-2 minutes.

## 2017-06-07 ENCOUNTER — Other Ambulatory Visit (HOSPITAL_COMMUNITY): Payer: Medicaid Other

## 2017-06-07 DIAGNOSIS — R569 Unspecified convulsions: Secondary | ICD-10-CM | POA: Diagnosis not present

## 2017-06-07 LAB — RAPID URINE DRUG SCREEN, HOSP PERFORMED
AMPHETAMINES: NOT DETECTED
Barbiturates: NOT DETECTED
Benzodiazepines: POSITIVE — AB
COCAINE: NOT DETECTED
OPIATES: NOT DETECTED
Tetrahydrocannabinol: NOT DETECTED

## 2017-06-07 LAB — COMPREHENSIVE METABOLIC PANEL
ALT: 37 U/L (ref 17–63)
ANION GAP: 8 (ref 5–15)
AST: 33 U/L (ref 15–41)
Albumin: 4 g/dL (ref 3.5–5.0)
Alkaline Phosphatase: 118 U/L (ref 38–126)
BILIRUBIN TOTAL: 0.3 mg/dL (ref 0.3–1.2)
BUN: 13 mg/dL (ref 6–20)
CO2: 25 mmol/L (ref 22–32)
Calcium: 9 mg/dL (ref 8.9–10.3)
Chloride: 105 mmol/L (ref 101–111)
Creatinine, Ser: 0.64 mg/dL (ref 0.61–1.24)
Glucose, Bld: 96 mg/dL (ref 65–99)
POTASSIUM: 3.7 mmol/L (ref 3.5–5.1)
Sodium: 138 mmol/L (ref 135–145)
TOTAL PROTEIN: 7.5 g/dL (ref 6.5–8.1)

## 2017-06-07 LAB — CBC
HEMATOCRIT: 42.2 % (ref 39.0–52.0)
Hemoglobin: 14 g/dL (ref 13.0–17.0)
MCH: 31.4 pg (ref 26.0–34.0)
MCHC: 33.2 g/dL (ref 30.0–36.0)
MCV: 94.6 fL (ref 78.0–100.0)
PLATELETS: 206 10*3/uL (ref 150–400)
RBC: 4.46 MIL/uL (ref 4.22–5.81)
RDW: 15 % (ref 11.5–15.5)
WBC: 7.3 10*3/uL (ref 4.0–10.5)

## 2017-06-07 LAB — HIV ANTIBODY (ROUTINE TESTING W REFLEX): HIV Screen 4th Generation wRfx: NONREACTIVE

## 2017-06-07 NOTE — Progress Notes (Signed)
MEDICATION RELATED CONSULT NOTE - INITIAL   Pharmacy Consult for Drug interations for antiepileptic meds Indication: PTA Tegretol and Keppra  No Known Allergies  Patient Measurements: Height: 5\' 5"  (165.1 cm) Weight: 167 lb 15.9 oz (76.2 kg) IBW/kg (Calculated) : 61.5   Vital Signs: Temp: 97.6 F (36.4 C) (10/18 2235) Temp Source: Oral (10/18 2235) BP: 117/71 (10/18 2235) Pulse Rate: 76 (10/18 2235) Intake/Output from previous day: 10/18 0701 - 10/19 0700 In: 100 [IV Piggyback:100] Out: -  Intake/Output from this shift: Total I/O In: 100 [IV Piggyback:100] Out: -   Labs:  Recent Labs  06/06/17 1535  WBC 4.4  HGB 14.5  HCT 43.3  PLT 204  CREATININE 0.79   Estimated Creatinine Clearance: 136.9 mL/min (by C-G formula based on SCr of 0.79 mg/dL).   Microbiology: No results found for this or any previous visit (from the past 720 hour(s)).  Medical History: Past Medical History:  Diagnosis Date  . ADHD   . Cerebral palsy (HCC)   . Seizures (HCC)     Medications:  Prescriptions Prior to Admission  Medication Sig Dispense Refill Last Dose  . acetaminophen (TYLENOL) 325 MG tablet Take 650 mg by mouth daily as needed for mild pain.   Past Week at Unknown time  . Diclofenac-Misoprostol 75-0.2 MG TBEC Take 1 tablet by mouth 2 (two) times daily.  1 06/05/2017 at Unknown time  . methylphenidate 36 MG PO CR tablet Take 36 mg by mouth every morning.   06/06/2017 at Unknown time  . TEGRETOL-XR 200 MG 12 hr tablet Take 200 mg by mouth every evening.  3 06/05/2017 at Unknown time  . oxyCODONE (ROXICODONE) 5 MG immediate release tablet Take 1 tablet (5 mg total) by mouth every 6 (six) hours as needed for breakthrough pain. (Patient not taking: Reported on 06/06/2017) 7 tablet 0 Completed Course at Unknown time   Scheduled:  . carbamazepine  200 mg Oral BID  . diclofenac  75 mg Oral BID   And  . misoprostol  25 mcg Oral BID  . methylphenidate  36 mg Oral q morning - 10a     Assessment: Tegretol and Zofran > decreased effects of zofran Tegretol and Concerta > decreased effects of Concerta  May need to increase doses of zofran and Concerta if indicated.    Susanne GreenhouseGreen, Damyiah Moxley R 06/07/2017,12:22 AM

## 2017-06-07 NOTE — Progress Notes (Signed)
Patient's mother states that she has already picked up patient's tegretol from [facility].

## 2017-06-07 NOTE — Discharge Summary (Signed)
Discharge Summary  Roberto Manning ZOX:[id number] DOB: June 28, 1996  PCP: Patient, No Pcp Per  Admit date: 06/06/2017 Discharge date: 06/07/2017  Time spent: >58mins, more than 50% time spent on coordination of care.  Recommendations for Outpatient Follow-up:  1. F/u with PMD within a week  for hospital discharge follow up, repeat cbc/bmp at follow up 2. F/u with neurology in one week  Discharge Diagnoses:  Active Hospital Problems   Diagnosis Date Noted  . Seizures (HCC) 06/06/2017    Resolved Hospital Problems   Diagnosis Date Noted Date Resolved  No resolved problems to display.    Discharge Condition: stable  Diet recommendation: regular diet  Filed Weights   06/06/17 1503 06/06/17 2218 06/07/17 0230  Weight: 64.9 kg (143 lb) 76.2 kg (167 lb 15.9 oz) 76.2 kg (167 lb 15.9 oz)    History of present illness: ( per admitting MD Dr Selena Batten) Roberto Bible  is a 21 y.o. male, w seizure do apparently was at [employer] when experienced a witnessed tonic clonic seizure.  His mother helped him to the ground. Uncertain duration. Might have bit tongue. Last seizure in 01/2017.  Pt might have missed 1 dose tegretol per mother.   Pt was brought to ED for evaluation.    While in ED, he had another seizure briefly, generalized. Pt was loaded with Keppra 1000mg  iv x1 and given ativan as well.  Pt has not had any further seizure.   CT brain negative MRI C spine IMPRESSION: 1. No acute ligamentous injury or epidural collection. 2. Small right subarticular disc protrusion at C5-6 with mild right neural foraminal stenosis.    Hospital Course:  Principal Problem:   Seizures (HCC)    Recurrent seizure:  -Likely due to meds noncompliance, tegretol level less than 2. -Patient is loaded with keppra in the ED and is started on increased dose of tegretol in the hospital, -No more seizure in the hospital, he is back to baseline, basic labs unremarkable -Ct head no acute findings -case discussed  with neurology Dr Wilford Corner who reviewed chart and recommended continue home dose tegretol at discharge and close follow up with neurology outpatient.   Neck pain:  mri cervical spine1. No acute ligamentous injury or epidural collection. 2. Small right subarticular disc protrusion at C5-6 with mild right neural foraminal stenosis. Physical therapy, prn anlagesics. Outpatient follow up.  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   Procedures:  none  Consultations:  Neurology Dr Wilford Corner over the phone  Discharge Exam: BP 116/65 (BP Location: Left Arm)   Pulse 68   Temp 97.6 F (36.4 C) (Oral)   Resp 18   Ht 5\' 5"  (1.651 m)   Wt 76.2 kg (167 lb 15.9 oz)   SpO2 99%   BMI 27.96 kg/m   General: NAD. Aaox3, pleasant Cardiovascular: RRR Respiratory: CTABL Extremity: left hand chronic mild contracture, left leg shortened at baseline.   Discharge Instructions You were cared for by a hospitalist during your hospital stay. If you have any questions about your discharge medications or the care you received while you were in the hospital after you are discharged, you can call the unit and asked to speak with the hospitalist on call if the hospitalist that took care of you is not available. Once you are discharged, your primary care physician will handle any further medical issues. Please note that NO REFILLS for any discharge  medications will be authorized once you are discharged, as it is imperative that you return to your primary care physician (or establish a relationship with a primary care physician if you do not have one) for your aftercare needs so that they can reassess your need for medications and monitor your lab values.  Discharge Instructions    Diet general    Complete by:  As directed    Diet general    Complete by:  As directed    Increase  activity slowly    Complete by:  As directed    Increase activity slowly    Complete by:  As directed      Allergies as of 06/07/2017   No Known Allergies     Medication List    STOP taking these medications   oxyCODONE 5 MG immediate release tablet Commonly known as:  ROXICODONE     TAKE these medications   Diclofenac-Misoprostol 75-0.2 MG Tbec Take 1 tablet by mouth 2 (two) times daily.   methylphenidate 36 MG CR tablet Commonly known as:  CONCERTA Take 36 mg by mouth every morning.   TEGRETOL-XR 200 MG 12 hr tablet Generic drug:  carbamazepine Take 200 mg by mouth every evening.   TYLENOL 325 MG tablet Generic drug:  acetaminophen Take 650 mg by mouth daily as needed for mild pain.      No Known Allergies Follow-up Information    follow up with primary care physician in 1-2 weeks for hospital discharge follow up. Follow up.        follow up with neurology in 1-2 weeks Follow up.            The results of significant diagnostics from this hospitalization (including imaging, microbiology, ancillary and laboratory) are listed below for reference.    Significant Diagnostic Studies: Ct Head Wo Contrast  Result Date: 06/06/2017 CLINICAL DATA:  Seizures when. EXAM: CT HEAD WITHOUT CONTRAST CT CERVICAL SPINE WITHOUT CONTRAST TECHNIQUE: Multidetector CT imaging of the head and cervical spine was performed following the standard protocol without intravenous contrast. Multiplanar CT image reconstructions of the cervical spine were also generated. COMPARISON:  CT head dated January 22, 2017. FINDINGS: CT HEAD FINDINGS Brain: No evidence of acute infarction, hemorrhage, hydrocephalus, extra-axial collection or mass lesion/mass effect. Vascular: No hyperdense vessel or unexpected calcification. Skull: Normal. Negative for fracture or focal lesion. Sinuses/Orbits: No acute finding. Other: None. CT CERVICAL SPINE FINDINGS Alignment: Reversal of the normal cervical lordosis  centered at C5. No traumatic malalignment. Skull base and vertebrae: No acute fracture. No primary bone lesion or focal pathologic process. Soft tissues and spinal canal: Prevertebral soft tissue thickness is at the upper limits of normal, measuring 7 mm. Slightly prominent posterior longitudinal ligament from C1-C3. Disc levels:  Normal. Upper chest: Negative. Other: None. IMPRESSION: 1.  No acute intracranial abnormality. 2.  No acute cervical spine fracture. 3. Slightly prominent posterior longitudinal ligament from C1-C3. While this may represent hypertrophy, given the borderline thickened prevertebral soft tissues, cervical spine MRI is recommended to exclude ligamentous injury. Electronically Signed   By: Obie Dredge M.D.   On: 06/06/2017 19:19   Ct Cervical Spine Wo Contrast  Result Date: 06/06/2017 CLINICAL DATA:  Seizures when. EXAM: CT HEAD WITHOUT CONTRAST CT CERVICAL SPINE WITHOUT CONTRAST TECHNIQUE: Multidetector CT imaging of the head and cervical spine was performed following the standard protocol without intravenous contrast. Multiplanar CT image reconstructions of the cervical spine were also generated. COMPARISON:  CT head dated January 22, 2017. FINDINGS: CT HEAD FINDINGS Brain: No evidence of acute infarction, hemorrhage, hydrocephalus, extra-axial collection or mass lesion/mass effect. Vascular: No hyperdense vessel or unexpected calcification. Skull: Normal. Negative for fracture or focal lesion. Sinuses/Orbits: No acute finding. Other: None. CT CERVICAL SPINE FINDINGS Alignment: Reversal of the normal cervical lordosis centered at C5. No traumatic malalignment. Skull base and vertebrae: No acute fracture. No primary bone lesion or focal pathologic process. Soft tissues and spinal canal: Prevertebral soft tissue thickness is at the upper limits of normal, measuring 7 mm. Slightly prominent posterior longitudinal ligament from C1-C3. Disc levels:  Normal. Upper chest: Negative. Other: None.  IMPRESSION: 1.  No acute intracranial abnormality. 2.  No acute cervical spine fracture. 3. Slightly prominent posterior longitudinal ligament from C1-C3. While this may represent hypertrophy, given the borderline thickened prevertebral soft tissues, cervical spine MRI is recommended to exclude ligamentous injury. Electronically Signed   By: Obie Dredge M.D.   On: 06/06/2017 19:19   Mr Cervical Spine Wo Contrast  Result Date: 06/06/2017 CLINICAL DATA:  Seizure. Abnormality of the posterior longitudinal ligament on cervical spine CT earlier today. EXAM: MRI CERVICAL SPINE WITHOUT CONTRAST TECHNIQUE: Multiplanar, multisequence MR imaging of the cervical spine was performed. No intravenous contrast was administered. COMPARISON:  None. FINDINGS: Alignment: Physiologic. Vertebrae/ligaments: No fracture, evidence of discitis, or bone lesion. No evidence of acute ligamentous injury. The abnormality along the ventral spinal canal at C2-C4 is not confirmed by MRI. No epidural hematoma. Cord: Normal signal and morphology. Posterior Fossa, vertebral arteries, paraspinal tissues: Negative. Disc levels: C1-C2: Normal. C2-C3: Normal disc space and facets. No spinal canal or neuroforaminal stenosis. C3-C4: Normal disc space and facets. No spinal canal or neuroforaminal stenosis. C4-C5: Normal disc space and facets. No spinal canal or neuroforaminal stenosis. C5-C6: Small right subarticular disc protrusion causes mild right neural foraminal stenosis. C6-C7: Normal disc space and facets. No spinal canal or neuroforaminal stenosis. C7-T1: Normal disc space and facets. No spinal canal or neuroforaminal stenosis. IMPRESSION: 1. No acute ligamentous injury or epidural collection. 2. Small right subarticular disc protrusion at C5-6 with mild right neural foraminal stenosis. Electronically Signed   By: Deatra Robinson M.D.   On: 06/06/2017 22:04    Microbiology: No results found for this or any previous visit (from the past 240  hour(s)).   Labs: Basic Metabolic Panel:  Recent Labs Lab 06/06/17 1535 06/07/17 0457  NA 142 138  K 4.2 3.7  CL 106 105  CO2 25 25  GLUCOSE 90 96  BUN 16 13  CREATININE 0.79 0.64  CALCIUM 9.5 9.0   Liver Function Tests:  Recent Labs Lab 06/07/17 0457  AST 33  ALT 37  ALKPHOS 118  BILITOT 0.3  PROT 7.5  ALBUMIN 4.0   No results for input(s): LIPASE, AMYLASE in the last 168 hours. No results for input(s): AMMONIA in the last 168 hours. CBC:  Recent Labs Lab 06/06/17 1535 06/07/17 0457  WBC 4.4 7.3  NEUTROABS 2.3  --   HGB 14.5 14.0  HCT 43.3 42.2  MCV 94.5 94.6  PLT 204 206   Cardiac Enzymes: No results for input(s): CKTOTAL, CKMB, CKMBINDEX, TROPONINI in the last 168 hours. BNP: BNP (last 3 results) No results for input(s): BNP in the last 8760 hours.  ProBNP (last 3 results) No results for input(s): PROBNP in the last 8760 hours.  CBG:  Recent Labs Lab 06/06/17 1506  GLUCAP 88       Signed:  Lucius Wise MD, PhD  [facility] 06/07/2017, 12:30 PM

## 2017-06-07 NOTE — Progress Notes (Signed)
EEG does not need to be completed prior to discharge per Dr. Roda ShuttersXu.

## 2017-06-07 NOTE — Progress Notes (Signed)
Discharge information given to patient and his mother.  Patient's mother demonstrated and verbalized understanding via teach back method.  Patient ready for discharge at this time.

## 2017-06-07 NOTE — Progress Notes (Signed)
EEG not completed due to computer issues. Will do as soon as we can.

## 2018-02-06 ENCOUNTER — Emergency Department (HOSPITAL_COMMUNITY)
Admission: EM | Admit: 2018-02-06 | Discharge: 2018-02-06 | Disposition: A | Discharge status: Home / Self Care | Payer: Medicaid Other | Attending: Emergency Medicine | Admitting: Emergency Medicine

## 2018-02-06 ENCOUNTER — Encounter (HOSPITAL_COMMUNITY): Payer: Self-pay

## 2018-02-06 ENCOUNTER — Other Ambulatory Visit: Payer: Self-pay

## 2018-02-06 DIAGNOSIS — R569 Unspecified convulsions: Secondary | ICD-10-CM | POA: Insufficient documentation

## 2018-02-06 DIAGNOSIS — Z79899 Other long term (current) drug therapy: Secondary | ICD-10-CM | POA: Diagnosis not present

## 2018-02-06 DIAGNOSIS — G809 Cerebral palsy, unspecified: Secondary | ICD-10-CM | POA: Insufficient documentation

## 2018-02-06 LAB — CBC WITH DIFFERENTIAL/PLATELET
Abs Immature Granulocytes: 0 10*3/uL (ref 0.0–0.1)
BASOS ABS: 0 10*3/uL (ref 0.0–0.1)
Basophils Relative: 0 %
EOS PCT: 2 %
Eosinophils Absolute: 0.1 10*3/uL (ref 0.0–0.7)
HCT: 44.6 % (ref 39.0–52.0)
Hemoglobin: 14 g/dL (ref 13.0–17.0)
IMMATURE GRANULOCYTES: 0 %
LYMPHS PCT: 15 %
Lymphs Abs: 0.8 10*3/uL (ref 0.7–4.0)
MCH: 30.9 pg (ref 26.0–34.0)
MCHC: 31.4 g/dL (ref 30.0–36.0)
MCV: 98.5 fL (ref 78.0–100.0)
Monocytes Absolute: 0.5 10*3/uL (ref 0.1–1.0)
Monocytes Relative: 10 %
NEUTROS PCT: 73 %
Neutro Abs: 4.1 10*3/uL (ref 1.7–7.7)
Platelets: 215 10*3/uL (ref 150–400)
RBC: 4.53 MIL/uL (ref 4.22–5.81)
RDW: 14.7 % (ref 11.5–15.5)
WBC: 5.6 10*3/uL (ref 4.0–10.5)

## 2018-02-06 LAB — CBG MONITORING, ED
GLUCOSE-CAPILLARY: 77 mg/dL (ref 65–99)
GLUCOSE-CAPILLARY: 87 mg/dL (ref 65–99)

## 2018-02-06 LAB — BASIC METABOLIC PANEL
Anion gap: 14 (ref 5–15)
BUN: 15 mg/dL (ref 6–20)
CHLORIDE: 105 mmol/L (ref 101–111)
CO2: 21 mmol/L — ABNORMAL LOW (ref 22–32)
Calcium: 9.4 mg/dL (ref 8.9–10.3)
Creatinine, Ser: 0.78 mg/dL (ref 0.61–1.24)
Glucose, Bld: 87 mg/dL (ref 65–99)
POTASSIUM: 3.9 mmol/L (ref 3.5–5.1)
SODIUM: 140 mmol/L (ref 135–145)

## 2018-02-06 LAB — CARBAMAZEPINE LEVEL, TOTAL: CARBAMAZEPINE LVL: 3.6 ug/mL — AB (ref 4.0–12.0)

## 2018-02-06 NOTE — ED Provider Notes (Addendum)
[facility] EMERGENCY DEPARTMENT Provider Note   CSN: [id number] Arrival date & time: 02/06/18  1152     History   Chief Complaint Chief Complaint  Patient presents with  . Seizures    HPI Roberto Manning is a 22 y.o. male with a hx of ADHD, cerebral palsy, and seizures who presents to the ED via EMS s/p seizure just prior to arrival. Patient states he was walking when he tripped and fell- no head injury or LOC. He subsequently had a seizure. Seizure witnessed by his brother who is at bedside- describes a tonic-clonic seizure that lasted about 2 minutes (similar to previous seizures patient has had), patient was somewhat "out of it" for about 1-2 minutes following, and now appears at baseline per patient and family. Patient currently has no complaints, therefore no alleviating/aggravating factors. He states he has been taking his medications as prescribed. Denies alcohol/drug use. Denies fever, cough, dysuria, chest pain, change in vision, numbness, weakness, or headache. Last seizure was in January.   HPI  Past Medical History:  Diagnosis Date  . ADHD   . Cerebral palsy (HCC)   . Seizures [facility])     Patient Active Problem List   Diagnosis Date Noted  . Seizures (HCC) 06/06/2017    Past Surgical History:  Procedure Laterality Date  . HIP PINNING          Home Medications    Prior to Admission medications   Medication Sig Start Date End Date Taking? Authorizing Provider  acetaminophen (TYLENOL) 325 MG tablet Take 650 mg by mouth daily as needed for mild pain.    [provider]  Diclofenac-Misoprostol 75-0.2 MG TBEC Take 1 tablet by mouth 2 (two) times daily. 03/27/17   [provider]  methylphenidate 36 MG PO CR tablet Take 36 mg by mouth every morning.    [provider]  TEGRETOL-XR 200 MG 12 hr tablet Take 200 mg by mouth every evening. 05/09/17   [provider]    Family History History reviewed. No pertinent  family history.  Social History Social History   Tobacco Use  . Smoking status: Never Smoker  . Smokeless tobacco: Never Used  Substance Use Topics  . Alcohol use: No  . Drug use: No     Allergies   Patient has no known allergies.   Review of Systems Review of Systems  Constitutional: Negative for chills and fever.  Respiratory: Negative for cough and shortness of breath.   Cardiovascular: Negative for chest pain.  Gastrointestinal: Negative for abdominal pain, diarrhea, nausea and vomiting.  Genitourinary: Negative for dysuria.  Neurological: Positive for seizures. Negative for weakness, numbness and headaches.  All other systems reviewed and are negative.    Physical Exam Updated Vital Signs There were no vitals taken for this visit.  Physical Exam  Constitutional: He is oriented to person, place, and time. He appears well-developed and well-nourished. No distress.  HENT:  Head: Normocephalic and atraumatic. Head is without raccoon's eyes and without Battle's sign.  Right Ear: Tympanic membrane normal. No hemotympanum.  Left Ear: Tympanic membrane normal. No hemotympanum.  Nose: Nose normal.  Mouth/Throat: Uvula is midline and oropharynx is clear and moist.  Eyes: Conjunctivae are normal. Right eye exhibits no discharge. Left eye exhibits no discharge.  Neck:  No midline tenderness, no step-off, no palpable crepitus.  Cardiovascular: Normal rate and regular rhythm.  No murmur heard. Pulmonary/Chest: Effort normal and breath sounds normal. No respiratory distress. He has no  wheezes. He has no rales.  Abdominal: Soft. He exhibits no distension. There is no tenderness.  Musculoskeletal:  Back: No midline tenderness Upper/lower extremities: Nontender.  Neurological: He is alert and oriented to person, place, and time.  Clear speech.  No facial droop.  CN III through XII grossly intact.  Sensation grossly intact bilateral upper and lower extremities.  Patient has 5  out of 5 symmetric grip strength.  He has a negative pronator drift.  His gait is intact and appears at baseline per patient and his family.  Skin: Skin is warm and dry. No rash noted.  Psychiatric: He has a normal mood and affect. His behavior is normal.  Nursing note and vitals reviewed.    ED Treatments / Results  Labs Results for orders placed or performed during the hospital encounter of 02/06/18  Basic metabolic panel  Result Value Ref Range   Sodium 140 135 - 145 mmol/L   Potassium 3.9 3.5 - 5.1 mmol/L   Chloride 105 101 - 111 mmol/L   CO2 21 (L) 22 - 32 mmol/L   Glucose, Bld 87 65 - 99 mg/dL   BUN 15 6 - 20 mg/dL   Creatinine, Ser 1.61 0.61 - 1.24 mg/dL   Calcium 9.4 8.9 - 09.6 mg/dL   GFR calc non Af Amer >60 >60 mL/min   GFR calc Af Amer >60 >60 mL/min   Anion gap 14 5 - 15  CBC WITH DIFFERENTIAL  Result Value Ref Range   WBC 5.6 4.0 - 10.5 K/uL   RBC 4.53 4.22 - 5.81 MIL/uL   Hemoglobin 14.0 13.0 - 17.0 g/dL   HCT 04.5 40.9 - 81.1 %   MCV 98.5 78.0 - 100.0 fL   MCH 30.9 26.0 - 34.0 pg   MCHC 31.4 30.0 - 36.0 g/dL   RDW 91.4 78.2 - 95.6 %   Platelets 215 150 - 400 K/uL   Neutrophils Relative % 73 %   Neutro Abs 4.1 1.7 - 7.7 K/uL   Lymphocytes Relative 15 %   Lymphs Abs 0.8 0.7 - 4.0 K/uL   Monocytes Relative 10 %   Monocytes Absolute 0.5 0.1 - 1.0 K/uL   Eosinophils Relative 2 %   Eosinophils Absolute 0.1 0.0 - 0.7 K/uL   Basophils Relative 0 %   Basophils Absolute 0.0 0.0 - 0.1 K/uL   Immature Granulocytes 0 %   Abs Immature Granulocytes 0.0 0.0 - 0.1 K/uL  CBG monitoring, ED  Result Value Ref Range   Glucose-Capillary 77 65 - 99 mg/dL  CBG monitoring, ED  Result Value Ref Range   Glucose-Capillary 87 65 - 99 mg/dL   Comment 1 Notify RN    No results found.  EKG None  Radiology No results found.  Procedures Procedures (including critical care time)  Medications Ordered in ED Medications - No data to display   Initial Impression /  Assessment and Plan / ED Course  I have reviewed the triage vital signs and the nursing notes.  Pertinent labs & imaging results that were available during my care of the patient were reviewed by me and considered in my medical decision making (see chart for details).  Patient with hx of seizures presents to the ED s/p seizure back to baseline. Patient nontoxic appearing, in no apparent distress, vitals WNL. Patient with no evidence of focal neuro deficits on physical exam and is at mental baseline.  Labs have been reviewed- no significant electrolyte abnormalities, anemia, or leukocytosis. Carbamazepine level subtherapeutic  at 3.6- discussed with ED pharmacist Revonda StandardAllison regarding possible loading dose or change- she does not recommend any type of loading dose or change in medication today due to carbamazepine being patient's anti-epileptic, will possibly require adjustment by neurologist in future. Patient observed without re-occurrence of seizure in the ER. Patient is advised to followup with neurologist in regards to today's event.  Spoke with patient and family in detail about driving restrictions until cleared by a neurologist.  Patient and family verbalize understanding.  Answered all questions.  Patient is hemodynamically stable and in no acute distress prior to discharge. Findings and plan of care discussed with supervising physician Dr. Charm BargesButler who personally evaluated and examined this patient and is in agreement with plan.   Clinical Course as of Feb 07 1307  Thu Feb 06, 2018  [id number] 22 year old male with history of seizure disorder is here after expressing a seizure.  Sounds like his last seizure was in January.  He denies any medical illnesses and feels back to baseline.  He is getting his carbamazepine level checked but likely will be discharged to follow-up with his neurologist.   [MB]    Clinical Course User Index [MB] Terrilee FilesButler, Michael C, MD    Final Clinical Impressions(s) / ED Diagnoses     Final diagnoses:  Seizure [facility](HCC)    ED Discharge Orders    None       Cherly Andersonetrucelli, Ozell Ferrera R, PA-C 02/06/18 1503    Cherly Andersonetrucelli, Ame Heagle R, PA-C 02/06/18 1527    Terrilee FilesButler, Michael C, MD 02/07/18 [phone]

## 2018-02-06 NOTE — Discharge Instructions (Addendum)
You were seen in the emergency department today following a seizure.  Your ab work showed that your medication level was somewhat low. Please continue to take your medication as prescribed.  We would like you to call your neurologist regarding today's seizure and make a follow-up appointment sometime within the next 3 days.  Do not drive or operate heavy machinery until you have been seen by your neurologist.  Return to the ER for new or worsening symptoms including but not limited to subsequent seizure, fever, vomiting, chest pain, trouble breathing, numbness, weakness, trouble walking, or any other concerns.

## 2018-02-06 NOTE — ED Triage Notes (Signed)
EMS- pt coming from home after a witnessed seizure by family. Hx of same. Pt takes tegretol and is compliant. Pt did fall during the seizure that lasted approximately 2 minutes. AOX4 on arrival.

## 2018-04-10 ENCOUNTER — Encounter (HOSPITAL_COMMUNITY): Payer: Self-pay

## 2018-04-10 ENCOUNTER — Other Ambulatory Visit: Payer: Self-pay

## 2018-04-10 ENCOUNTER — Emergency Department (HOSPITAL_COMMUNITY)
Admission: EM | Admit: 2018-04-10 | Discharge: 2018-04-10 | Disposition: A | Discharge status: Home / Self Care | Payer: Medicaid Other | Attending: Emergency Medicine | Admitting: Emergency Medicine

## 2018-04-10 DIAGNOSIS — G809 Cerebral palsy, unspecified: Secondary | ICD-10-CM | POA: Insufficient documentation

## 2018-04-10 DIAGNOSIS — R569 Unspecified convulsions: Secondary | ICD-10-CM | POA: Insufficient documentation

## 2018-04-10 DIAGNOSIS — Z79899 Other long term (current) drug therapy: Secondary | ICD-10-CM | POA: Diagnosis not present

## 2018-04-10 NOTE — ED Notes (Signed)
Bed: WA02 Expected date:  Expected time:  Means of arrival:  Comments: 22 yo seizures

## 2018-04-10 NOTE — ED Provider Notes (Signed)
[facility]-EMERGENCY DEPT Provider Note   CSN: [id number] Arrival date & time: 04/10/18  2152     History   Chief Complaint Chief Complaint  Patient presents with  . Seizures    HPI Roberto Manning is a 22 y.o. male.  The history is provided by the patient.  Seizures   This is a chronic problem. The current episode started less than 1 hour ago. The problem has been resolved. There was 1 seizure. The most recent episode lasted less than 30 seconds. Pertinent negatives include no sleepiness, no confusion, no headaches, no visual disturbance, no neck stiffness, no sore throat, no chest pain, no cough, no nausea, no vomiting and no diarrhea. Characteristics include rhythmic jerking. The episode was witnessed. The seizures did not continue in the ED. The seizure(s) had no focality. Possible causes do not include medication or dosage change, sleep deprivation, missed seizure meds, recent illness or change in alcohol use. There has been no fever. Associated symptoms comments: Hx of cerebral palsy. There were no medications administered prior to arrival.    Past Medical History:  Diagnosis Date  . ADHD   . Cerebral palsy (HCC)   . Seizures [facility])     Patient Active Problem List   Diagnosis Date Noted  . Seizures (HCC) 06/06/2017    Past Surgical History:  Procedure Laterality Date  . HIP PINNING          Home Medications    Prior to Admission medications   Medication Sig Start Date End Date Taking? Authorizing Provider  TEGRETOL-XR 200 MG 12 hr tablet Take 200 mg by mouth every evening. 05/09/17  Yes [provider]    Family History History reviewed. No pertinent family history.  Social History Social History   Tobacco Use  . Smoking status: Never Smoker  . Smokeless tobacco: Never Used  Substance Use Topics  . Alcohol use: No  . Drug use: No     Allergies   Patient has no known allergies.   Review of Systems Review of Systems    Constitutional: Negative for chills and fever.  HENT: Negative for ear pain and sore throat.   Eyes: Negative for pain and visual disturbance.  Respiratory: Negative for cough and shortness of breath.   Cardiovascular: Negative for chest pain and palpitations.  Gastrointestinal: Negative for abdominal pain, diarrhea, nausea, rectal pain and vomiting.  Genitourinary: Negative for dysuria and hematuria.  Musculoskeletal: Negative for arthralgias and back pain.  Skin: Negative for color change and rash.  Neurological: Positive for seizures. Negative for syncope and headaches.  Psychiatric/Behavioral: Negative for confusion.  All other systems reviewed and are negative.    Physical Exam Updated Vital Signs  ED Triage Vitals  Enc Vitals Group     BP 04/10/18 2207 112/72     Pulse Rate 04/10/18 2207 99     Resp 04/10/18 2207 16     Temp 04/10/18 2207 98 F (36.7 C)     Temp Source 04/10/18 2207 Oral     SpO2 04/10/18 2203 97 %     Weight 04/10/18 2209 150 lb (68 kg)     Height 04/10/18 2209 5\' 6"  (1.676 m)     Head Circumference --      Peak Flow --      Pain Score 04/10/18 2209 0     Pain Loc --      Pain Edu? --      Excl. in GC? --  Physical Exam  Constitutional: He is oriented to person, place, and time. He appears well-developed and well-nourished.  HENT:  Head: Normocephalic and atraumatic.  Eyes: Pupils are equal, round, and reactive to light. Conjunctivae and EOM are normal.  Neck: Normal range of motion. Neck supple.  Cardiovascular: Normal rate and regular rhythm.  No murmur heard. Pulmonary/Chest: Effort normal and breath sounds normal. No respiratory distress.  Abdominal: Soft. Bowel sounds are normal. There is no tenderness.  Musculoskeletal: Normal range of motion. He exhibits no edema.  Neurological: He is alert and oriented to person, place, and time. No cranial nerve deficit.  Patient was all extremities, normal sensation, contractures in upper and  lower extremity secondary to cerebral palsy  Skin: Skin is warm and dry.  Psychiatric: He has a normal mood and affect.  Nursing note and vitals reviewed.    ED Treatments / Results  Labs (all labs ordered are listed, but only abnormal results are displayed) Labs Reviewed - No data to display  EKG None  Radiology No results found.  Procedures Procedures (including critical care time)  Medications Ordered in ED Medications - No data to display   Initial Impression / Assessment and Plan / ED Course  I have reviewed the triage vital signs and the nursing notes.  Pertinent labs & imaging results that were available during my care of the patient were reviewed by me and considered in my medical decision making (see chart for details).     Roberto Manning is a 22 year old male with history of seizures, cerebral palsy who presents to the ED with seizures.  Patient with unremarkable vitals.  No fever.  Patient with seizure history and cerebral palsy history.  Patient is on Tegretol.  No missed doses.  Has had some poor sleep.  Otherwise no infectious symptoms.  No chest pain, no shortness of breath, no abdominal pain.  Patient had seizure-like activity for less than 20 seconds today and is now back at his baseline.  Neurologically intact.  Patient is stable.  Patient has seizures every several days.  Recommend close follow-up with neurology and discharged from ED in good condition given patient at baseline.  Had long discussion with mother about breakthrough seizures.  Recommend increase oral hydration and good sleep hygiene.  Final Clinical Impressions(s) / ED Diagnoses   Final diagnoses:  Seizure [facility](HCC)    ED Discharge Orders    None       Virgina NorfolkCuratolo, Gurinder Toral, DO 04/10/18 2253

## 2018-04-10 NOTE — ED Triage Notes (Signed)
Pt was brought by [facility] due to 2 episodes of seizure activity tonight. Pt has a hx of seizures and take Tegretol for maintance. Pt denies any changes in stress level and denies any illness. Pt denies N/V, SHOB.   18 G right AC

## 2018-05-05 ENCOUNTER — Encounter (HOSPITAL_COMMUNITY): Payer: Self-pay | Admitting: Emergency Medicine

## 2018-05-05 ENCOUNTER — Emergency Department (HOSPITAL_COMMUNITY): Payer: Medicaid Other

## 2018-05-05 ENCOUNTER — Emergency Department (HOSPITAL_COMMUNITY)
Admission: EM | Admit: 2018-05-05 | Discharge: 2018-05-05 | Disposition: A | Discharge status: Home / Self Care | Payer: Medicaid Other | Attending: Emergency Medicine | Admitting: Emergency Medicine

## 2018-05-05 DIAGNOSIS — F909 Attention-deficit hyperactivity disorder, unspecified type: Secondary | ICD-10-CM | POA: Diagnosis not present

## 2018-05-05 DIAGNOSIS — Z79899 Other long term (current) drug therapy: Secondary | ICD-10-CM | POA: Insufficient documentation

## 2018-05-05 DIAGNOSIS — G40909 Epilepsy, unspecified, not intractable, without status epilepticus: Secondary | ICD-10-CM | POA: Insufficient documentation

## 2018-05-05 DIAGNOSIS — R569 Unspecified convulsions: Secondary | ICD-10-CM

## 2018-05-05 LAB — CARBAMAZEPINE LEVEL, TOTAL: Carbamazepine Lvl: 7.6 ug/mL (ref 4.0–12.0)

## 2018-05-05 MED ORDER — KETOROLAC TROMETHAMINE 30 MG/ML IJ SOLN: 30.0000 mg | Freq: Once | INTRAMUSCULAR | Status: DC

## 2018-05-05 NOTE — ED Notes (Signed)
Patient verbalizes understanding of discharge instructions. Opportunity for questioning and answers were provided. Armband removed by staff, pt discharged from ED.  

## 2018-05-05 NOTE — ED Triage Notes (Signed)
Pt to ER for evaluation of seizure this morning, states hit his head and mother wants him evaluated. Reports compliance with medications. Pt in NAD.

## 2018-05-05 NOTE — ED Provider Notes (Signed)
[facility] EMERGENCY DEPARTMENT Provider Note   CSN: [id number] Arrival date & time: 05/05/18  1319     History   Chief Complaint Chief Complaint  Patient presents with  . Seizures    HPI Roberto Manning is a 22 y.o. male.  HPI   Roberto Manning is a 21yo male with a history of cerebral palsy and complex partial seizures who presents to the emergency department for evaluation of seizure-like activity earlier today.  Patient's mother states that he was walking up the stairs around 10:45 AM today when he suddenly fell backwards hitting his head and falling down 5 steps.  She reports that she did not notice any shaking, and quickly returned back to consciousness.  She reports that the entire episode lasted less than 10 seconds, and is similar to his previous seizure activity.  He had some postictal confusion, but is now at mental baseline according to his mother.  She did not initially think to bring him in because he has occasional seizures and this is similar to prior, but he has been complaining of a posterior headache ever since and she was concerned.  No medications for pain prior to arrival.  He has been taking his Tegretol 200 mg twice daily which is prescribed by his neurologist Dr. Allena Katz at [facility].  No new medications, alcohol or reported drug use.  His Tegretol makes him sleepy, patient denies loss of sleep.  No recent illness or fever.  He denies neck pain.  He reports headache has been constant and is located on his posterior occiput, is moderate in severity.  Worsened with palpation.  No blood thinner use.  He does report some left knee tenderness. He denies numbness, weakness, visual disturbance, tremors, chest pain, shortness of breath, palpitations, leg swelling, cough, congestion, sore throat.  He does state that his left knee is a little bit sore, is able to ambulate independently.  No open wounds.  Patient adds that he has dryness on his upper back and  would like the rash looked at.  Past Medical History:  Diagnosis Date  . ADHD   . Cerebral palsy (HCC)   . Seizures [facility])     Patient Active Problem List   Diagnosis Date Noted  . Seizures (HCC) 06/06/2017    Past Surgical History:  Procedure Laterality Date  . HIP PINNING          Home Medications    Prior to Admission medications   Medication Sig Start Date End Date Taking? Authorizing Provider  TEGRETOL-XR 200 MG 12 hr tablet Take 200 mg by mouth every evening. 05/09/17   [provider]    Family History History reviewed. No pertinent family history.  Social History Social History   Tobacco Use  . Smoking status: Never Smoker  . Smokeless tobacco: Never Used  Substance Use Topics  . Alcohol use: No  . Drug use: No     Allergies   Patient has no known allergies.   Review of Systems Review of Systems  Constitutional: Negative for chills and fever.  HENT: Negative for congestion and sore throat.   Eyes: Negative for visual disturbance.  Respiratory: Negative for cough and shortness of breath.   Cardiovascular: Negative for chest pain and palpitations.  Gastrointestinal: Negative for abdominal pain, nausea and vomiting.  Genitourinary: Negative for difficulty urinating.  Musculoskeletal: Positive for arthralgias. Negative for back pain, gait problem and neck pain.  Skin: Negative for color change and wound.  Neurological: Positive for seizures and headaches. Negative for tremors, facial asymmetry, speech difficulty, weakness, light-headedness and numbness.  Psychiatric/Behavioral: Negative for agitation.     Physical Exam Updated Vital Signs BP (!) 124/55   Pulse 70   Temp 97.9 F (36.6 C)   Resp 16   SpO2 98%   Physical Exam  Constitutional: He is oriented to person, place, and time. He appears well-developed and well-nourished. No distress.  HENT:  Head: Normocephalic and atraumatic.  Mouth/Throat: Oropharynx is clear and moist.    Occiput with small abrasion, no swelling.  No raccoon eyes or battle sign.  No hemotympanum.  No facial tenderness to palpation.  Eyes: Pupils are equal, round, and reactive to light. Conjunctivae are normal. Right eye exhibits no discharge. Left eye exhibits no discharge.  Neck: Normal range of motion. Neck supple.  No nuchal rigidity. No midline cervical spine tenderness.   Cardiovascular: Normal rate, regular rhythm and intact distal pulses.  No murmur heard. Pulmonary/Chest: Effort normal and breath sounds normal. No stridor. No respiratory distress. He has no wheezes. He has no rales.  Abdominal: Soft. Bowel sounds are normal. There is no tenderness.  Musculoskeletal:  No midline T-spine or L-spine tenderness.  Left knee without wound, swelling or ecchymosis.  No tenderness to palpation over the patella or knee joint.  Negative drawers.  All compartments soft.  Neurological: He is alert and oriented to person, place, and time. Coordination normal.  Mental Status:  Alert, oriented, thought content appropriate, able to give a coherent history. Speech fluent without evidence of aphasia. Able to follow 2 step commands without difficulty.  Cranial Nerves:  II:  Peripheral visual fields grossly normal, pupils equal, round, reactive to light III,IV, VI: ptosis not present, extra-ocular motions intact bilaterally  V,VII: smile symmetric, facial light touch sensation equal VIII: hearing grossly normal to voice  X: uvula elevates symmetrically  XI: bilateral shoulder shrug symmetric and strong XII: midline tongue extension without fassiculations Motor:  Normal tone. 5/5 in upper and lower extremities bilaterally including strong and equal grip strength and dorsiflexion/plantar flexion Sensory: Pinprick and light touch normal in all extremities.  Cerebellar: normal finger-to-nose with bilateral upper extremities Gait: normal gait and balance CV: distal pulses palpable throughout   Skin: Skin  is warm and dry. He is not diaphoretic.  Dry and flaky skin noted over upper back. No erythema, pustules or vesicles. No overlying warmth or tenderness.   Psychiatric: He has a normal mood and affect. His behavior is normal.  Nursing note and vitals reviewed.    ED Treatments / Results  Labs (all labs ordered are listed, but only abnormal results are displayed) Labs Reviewed  CARBAMAZEPINE LEVEL, TOTAL    EKG None  Radiology Ct Head Wo Contrast  Result Date: 05/05/2018 CLINICAL DATA:  Evaluate seizures. EXAM: CT HEAD WITHOUT CONTRAST TECHNIQUE: Contiguous axial images were obtained from the base of the skull through the vertex without intravenous contrast. COMPARISON:  06/06/2017. FINDINGS: Brain: No evidence of acute infarction, hemorrhage, hydrocephalus, extra-axial collection or mass lesion/mass effect. Normal for age cerebral volume. Mildly prominent white matter hypoattenuation/gray-white junction, appears consistent with prior imaging, and normal for this patient. Vascular: No hyperdense vessel or unexpected calcification. Skull: Normal. Negative for fracture or focal lesion. Sinuses/Orbits: No acute finding. Other: None. IMPRESSION: Negative exam with no change from priors. Electronically Signed   By: Elsie Stain M.D.   On: 05/05/2018 20:49    Procedures Procedures (including critical care time)  Medications Ordered in  ED  Initial Impression / Assessment and Plan / ED Course  I have reviewed the triage vital signs and the nursing notes.  Pertinent labs & imaging results that were available during my care of the patient were reviewed by me and considered in my medical decision making (see chart for details).     Patient with a history of cerebral palsy and seizures presents to the emergency department after a seizure which per his mother is similar to prior. Fell backwards on stairs and hit his head against five wooden steps down the stairs. No blood thinner use or  vomiting. He continues to complain of headache. No cervical spine tenderness. No neurological deficits on exam. According to his mother he has been taking his tegretol as prescribed. No fever, nuchal rigidity or altered mental status and I do not suspect infectious source. Tegretol level WNL. CT head without acute abnormality. On recheck patient denies headache. Reports he is baseline and would like to go home. Repeat neuro exam without deficits. In terms of his knee pain, I not suspect acute fracture requiring xray. Mother at bedside agrees. We discussed RICE protocol. In terms of his upper back, rash appears to be eczema. I discussed hydrating lotion and follow up with PCP if no improvement.   Patient advised to followup with neurologist in regards to today's event. I discussed seizure precautions at home including driving, swimming, bathing. Patient and his mother agree and verbalize understanding. Patient is hemodynamically stable and in no acute distress prior to discharge.  Final Clinical Impressions(s) / ED Diagnoses   Final diagnoses:  Seizure-like activity [facility](HCC)    ED Discharge Orders    None       Lawrence MarseillesShrosbree, Grace Haggart J, PA-C 05/05/18 2157    Sabas SousBero, Michael M, MD 05/05/18 2213

## 2018-05-05 NOTE — Discharge Instructions (Addendum)
Take your Tegretol at home as prescribed.  Your blood work and head scan were reassuring today.  Follow-up with your neurologist regarding today's ER visit.  Return to the ER for any new or concerning symptoms like seizure lasting greater than a minute, new seizure, fever or vomiting.

## 2018-05-05 NOTE — ED Notes (Signed)
Pt's mom reports pt having seizure episode around 1000 today.  She reports pt normally have a grand-mal seizure but she woke up to him having one, but she did not witness shaking.  "He was kind of just staring."  She reports the seizure lasted about a few seconds.  His last episode was about Saturday after last and that Thursday.  She reports he is waiting a call from his Neurologist to schedule his EEG.  She do not want his Tegretol dose increased until pt has an EEG.  Pt is A&OX 4.  In NAD.  He is also endorses rash on his back, which started on his neck. Very dry skin noted.

## 2018-10-29 ENCOUNTER — Ambulatory Visit: Payer: Medicaid Other | Attending: Physical Medicine and Rehabilitation | Admitting: Occupational Therapy

## 2018-10-29 DIAGNOSIS — G8114 Spastic hemiplegia affecting left nondominant side: Secondary | ICD-10-CM | POA: Insufficient documentation

## 2018-10-29 DIAGNOSIS — R2681 Unsteadiness on feet: Secondary | ICD-10-CM | POA: Diagnosis present

## 2018-10-29 DIAGNOSIS — R278 Other lack of coordination: Secondary | ICD-10-CM | POA: Insufficient documentation

## 2018-10-29 DIAGNOSIS — M25622 Stiffness of left elbow, not elsewhere classified: Secondary | ICD-10-CM | POA: Diagnosis present

## 2018-10-29 DIAGNOSIS — M25632 Stiffness of left wrist, not elsewhere classified: Secondary | ICD-10-CM

## 2018-10-29 NOTE — Therapy (Addendum)
[facility] [address] [place], [place], [address] Phone: [phone]   Fax:  [phone]  Occupational Therapy Evaluation  Patient Details  Name: Roberto Manning MRN: [id number] Date of Birth: 1995-12-15 Referring Provider (OT): Dr. Windle Guard   Encounter Date: 10/29/2018  OT End of Session - 10/29/18 1137    Visit Number  1    Number of Visits  12    Date for OT Re-Evaluation  12/29/18    Authorization Type  MCD - awaiting authorization    OT Start Time  1015    OT Stop Time  1115    OT Time Calculation (min)  60 min    Activity Tolerance  Patient tolerated treatment well    Behavior During Therapy  Impulsive       Past Medical History:  Diagnosis Date  . ADHD   . Cerebral palsy (HCC)   . Seizures (HCC)     Past Surgical History:  Procedure Laterality Date  . HIP PINNING      There were no vitals filed for this visit.  Subjective Assessment - 10/29/18 1025    Patient is accompanied by:  Family member   MOTHER   Pertinent History  Lt sided hemiplegia d/t CP, seizures, hip pinning     Limitations  **seizures    Currently in Pain?  No/denies        [facility] OT Assessment - 10/29/18 0001      Assessment   Medical Diagnosis  Lt sided hemiplegic CP    Referring Provider (OT)  Dr. Windle Guard    Onset Date/Surgical Date  --   congenital   Hand Dominance  Right    Next MD Visit  11/12/18      Precautions   Precautions  Other (comment)    Precaution Comments  seizures      Restrictions   Weight Bearing Restrictions  No      Balance Screen   Has the patient fallen in the past 6 months  Yes    How many times?  5 per day      Home  Environment   Additional Comments  lives w/ mom in townhome. Daughter currently home and helps but mother reports this situation is temporary      Prior Function   Level of Independence  Needs assistance with ADLs   Needs assist w/ mobility to prevent falls    Comments  Mom reports pt often gets up before mom can get to him to prevent falls. Falls possibly d/t physical impairments of CP but also cognitive impairments (impulsivity, forgetting to use DME) and seizures.       ADL   Eating/Feeding  Needs assist with cutting food    Grooming  Modified independent   occasional assist from mom   Upper Body Bathing  Minimal assistance    Lower Body Bathing  Minimal assistance    Upper Body Dressing  Minimal assistance   shirt often gets turned around, assist w/ buttons   Lower Body Dressing  Moderate assistance   assist w/ tying shoes, fastening belt   Toilet Transfer  Modified independent    Toileting -  Hygiene  Modified Independent    Tub/Shower Transfer  Minimal assistance   occasionally, has shower chair but doesn't use     Mobility   Mobility Status  History of falls    Mobility Status Comments  hand held assist when outside of home  Written Expression   Dominant Hand  Right    Handwriting  100% legible   in print     Vision - History   Additional Comments  intact per pt report      Cognition   Awareness  Impaired    Awareness Impairment  Intellectual impairment    Cognition Comments  Pt was in special education class when in school. Pt can follow commands and responds appropriately.       Sensation   Light Touch  Appears Intact      Coordination   Box and Blocks  TBA      Edema   Edema  none      Tone   Assessment Location  Left Upper Extremity;Left Lower Extremity      ROM / Strength   AROM / PROM / Strength  AROM      AROM   Overall AROM Comments  RUE WFL's. Lt shoulder WFL's, active elbow extension -40*, (passive to -30*), Wrist flexed b/t 30-40* at rest but can actively extend to 15* flexion. Forearm supination only to neutral, finger flexion/ext WFL's      LUE Tone   LUE Tone  Hypertonic                        OT Short Term Goals - 10/29/18 1144      OT SHORT TERM GOAL #1   Title   Pt/family will verbalize understanding of splint wear and care    Baseline  dependent    Time  4    Period  Weeks    Status  New      OT SHORT TERM GOAL #2   Title  Pt/family will be independent with HEP for LUE including functional activities    Baseline  Dependent    Time  4    Period  Weeks    Status  New        OT Long Term Goals - 10/29/18 1202      OT LONG TERM GOAL #1   Title  Pt/family to verbalize understanding with A/E and DME needs to increase safety and independence with BADLS    Baseline  DEPENDENT    Time  8    Period  Weeks    Status  New      OT LONG TERM GOAL #2   Title  Pt/family to be provided w/ community resources and services that will help reduce caregiver burden of care    Baseline  dependent    Time  8    Period  Weeks    Status  New      OT LONG TERM GOAL #3   Title  LUE function to improve as evidenced by improving Box & Blocks by 3 blocks    Baseline  TBA     Time  8    Period  Weeks    Status  New            Plan - 10/29/18 1138    Clinical Impression Statement  Pt is a 23 y.o. 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 Pt also has seizures, and multiple falls per day.    OT Occupational Profile and History  Problem Focused Assessment - Including review of records relating to presenting problem    Occupational performance deficits (Please refer to evaluation for details):  ADL's;IADL's;Work;Leisure    Body Structure / Function /  Physical Skills  ADL;Decreased knowledge of precautions;ROM;UE functional use;Improper spinal/pelvic alignment;Balance;FMC;Mobility;Dexterity;Body mechanics;Strength;Pain;IADL;Proprioception;Tone    Cognitive Skills  Attention;Safety Awareness;Memory    Rehab Potential  Fair    Clinical Decision Making  Several treatment options, min-mod task modification necessary    Comorbidities Affecting Occupational Performance:  Presence of comorbidities impacting  occupational performance    Comorbidities impacting occupational performance description:  seizures    Modification or Assistance to Complete Evaluation   Min-Moderate modification of tasks or assist with assess necessary to complete eval    OT Frequency  --   3 visits over first month, followed by 2x/wk for 4 weeks   OT Treatment/Interventions  Self-care/ADL training;Therapeutic exercise;Coping strategies training;Neuromuscular education;Splinting;Patient/family education;Building services engineer;Therapeutic activities;DME and/or AE instruction;Passive range of motion;Cognitive remediation/compensation    Plan  assess Box & Blocks, issue pre-fab D-ring brace, initiate HEP, A/E recommendations, (any info we could give on CAPS, MCD services?)    Consulted and Agree with Plan of Care  Patient;Family member/caregiver    Family Member Consulted  mother       Patient will benefit from skilled therapeutic intervention in order to improve the following deficits and impairments:  Body Structure / Function / Physical Skills, Cognitive Skills  Visit Diagnosis: Spastic hemiplegia affecting left nondominant side, unspecified etiology (HCC) - Plan: Ot plan of care cert/re-cert  Stiffness of left elbow, not elsewhere classified - Plan: Ot plan of care cert/re-cert  Stiffness of left wrist, not elsewhere classified - Plan: Ot plan of care cert/re-cert  Other lack of coordination - Plan: Ot plan of care cert/re-cert  Unsteadiness on feet - Plan: Ot plan of care cert/re-cert    Problem List Patient Active Problem List   Diagnosis Date Noted  . Seizures (HCC) 06/06/2017    Kelli Churn, OTR/L 10/29/2018, 12:33 PM  [facility] [address] [place], [place], [address] Phone: [phone]   Fax:  [phone]  Name: Roberto Manning MRN: [id number] Date of Birth: 1996/06/26

## 2018-11-10 ENCOUNTER — Telehealth: Payer: Self-pay

## 2018-11-10 NOTE — Telephone Encounter (Signed)
Roberto Manning's mother Roberto Manning) was contacted today regarding the temporary closing of [facility] due to Covid-19.  Therapist discussed:  Closing, and rescheduling appointments prn once center is back open  Patient is not interested in further information for an e-visit, virtual check in, or telehealth visit, if those services become available.    [facility] will follow up with patients when we are able to resume care.  Roberto Manning, OTR/L [facility] [address] [place], [place]  [address] Phone:  [phone] Fax:  [phone] \

## 2018-11-13 ENCOUNTER — Ambulatory Visit: Payer: Medicaid Other | Admitting: Occupational Therapy

## 2018-11-18 ENCOUNTER — Encounter: Payer: Medicaid Other | Admitting: Occupational Therapy

## 2018-12-01 ENCOUNTER — Encounter: Payer: Medicaid Other | Admitting: Occupational Therapy

## 2018-12-02 ENCOUNTER — Encounter: Payer: Medicaid Other | Admitting: Occupational Therapy

## 2019-01-11 ENCOUNTER — Emergency Department (HOSPITAL_COMMUNITY)
Admission: EM | Admit: 2019-01-11 | Discharge: 2019-01-11 | Disposition: A | Discharge status: Home / Self Care | Payer: Medicaid Other | Attending: Emergency Medicine | Admitting: Emergency Medicine

## 2019-01-11 DIAGNOSIS — G40909 Epilepsy, unspecified, not intractable, without status epilepticus: Secondary | ICD-10-CM | POA: Diagnosis not present

## 2019-01-11 DIAGNOSIS — G809 Cerebral palsy, unspecified: Secondary | ICD-10-CM | POA: Diagnosis not present

## 2019-01-11 DIAGNOSIS — R569 Unspecified convulsions: Secondary | ICD-10-CM

## 2019-01-11 MED ORDER — LORAZEPAM 1 MG PO TABS
1.0000 mg | ORAL_TABLET | Freq: Once | ORAL | Status: AC
  Administered 2019-01-11: 1 mg via ORAL
  Filled 2019-01-11: qty 1

## 2019-01-11 NOTE — ED Provider Notes (Signed)
[facility]   CSN: [id number] Arrival date & time: 01/11/19  1809    History   Chief Complaint Chief Complaint  Patient presents with   Seizures    HPI Roberto Manning is a 23 y.o. male.     HPI   23 year old male with past medical history of seizure disorder, cerebral palsy, here with witnessed seizure.  Per report, patient had a witnessed, generalized tonic-clonic seizure.  This is his usual seizure-like activity.  This occurred in setting of missing his Tegretol as he states he has been out for the last several days.  He denies any trauma and did not fall.  He was caught before falling.  He currently states he feels completely back to his baseline.  He is able to recall the events prior to and since the seizure.  He states this is exactly like his normal seizure, which often occur when he misses his doses.  Denies any fevers, chills.  No headache.  No tongue trauma.  No other recent medication changes.  No sick contacts.  Past Medical History:  Diagnosis Date   ADHD    Cerebral palsy (HCC)    Seizures (HCC)     Patient Active Problem List   Diagnosis Date Noted   Seizures (HCC) 06/06/2017    Past Surgical History:  Procedure Laterality Date   HIP PINNING          Home Medications    Prior to Admission medications   Medication Sig Start Date End Date Taking? Authorizing Provider  carbamazepine (TEGRETOL XR) 200 MG 12 hr tablet Take 200 mg by mouth 2 (two) times daily.    [provider]    Family History No family history on file.  Social History Social History   Tobacco Use   Smoking status: Never Smoker   Smokeless tobacco: Never Used  Substance Use Topics   Alcohol use: No   Drug use: No     Allergies   Patient has no known allergies.   Review of Systems Review of Systems  Constitutional: Negative for chills, fatigue and fever.  HENT: Negative for congestion and rhinorrhea.    Eyes: Negative for visual disturbance.  Respiratory: Negative for cough, shortness of breath and wheezing.   Cardiovascular: Negative for chest pain and leg swelling.  Gastrointestinal: Negative for abdominal pain, diarrhea, nausea and vomiting.  Genitourinary: Negative for dysuria and flank pain.  Musculoskeletal: Negative for neck pain and neck stiffness.  Skin: Negative for rash and wound.  Allergic/Immunologic: Negative for immunocompromised state.  Neurological: Positive for seizures. Negative for syncope, weakness and headaches.  All other systems reviewed and are negative.    Physical Exam Updated Vital Signs BP 129/64 (BP Location: Left Arm)    Pulse 77    Temp 98.3 F (36.8 C) (Oral)    Resp 15    SpO2 100%   Physical Exam Vitals signs and nursing note reviewed.  Constitutional:      General: He is not in acute distress.    Appearance: He is well-developed.  HENT:     Head: Normocephalic and atraumatic.  Eyes:     Conjunctiva/sclera: Conjunctivae normal.  Neck:     Musculoskeletal: Neck supple.  Cardiovascular:     Rate and Rhythm: Normal rate and regular rhythm.     Heart sounds: Normal heart sounds. No murmur. No friction rub.  Pulmonary:     Effort: Pulmonary effort is normal. No respiratory distress.  Breath sounds: Normal breath sounds. No wheezing or rales.  Abdominal:     General: There is no distension.     Palpations: Abdomen is soft.     Tenderness: There is no abdominal tenderness.  Skin:    General: Skin is warm.     Capillary Refill: Capillary refill takes less than 2 seconds.  Neurological:     Mental Status: He is alert and oriented to person, place, and time.     Motor: No abnormal muscle tone.     Neurological Exam:  Mental Status: Alert and oriented to person, place, and time. Attention and concentration normal. Speech clear. Recent memory is intact. Cranial Nerves: Visual fields grossly intact. EOMI and PERRLA. No nystagmus noted.  Facial sensation intact at forehead, maxillary cheek, and chin/mandible bilaterally. No facial asymmetry or weakness. Hearing grossly normal. Uvula is midline, and palate elevates symmetrically. Normal SCM and trapezius strength. Tongue midline without fasciculations. Motor: Muscle strength 5/5 in proximal and distal UE and LE bilaterally. No pronator drift. Muscle tone normal. Reflexes: 2+ and symmetrical in all four extremities.  Sensation: Intact to light touch in upper and lower extremities distally bilaterally.  Gait: Normal without ataxia. Coordination: Normal FTN bilaterally.      ED Treatments / Results  Labs (all labs ordered are listed, but only abnormal results are displayed) Labs Reviewed - No data to display  EKG None  Radiology No results found.  Procedures Procedures (including critical care time)  Medications Ordered in ED Medications  LORazepam (ATIVAN) tablet 1 mg (1 mg Oral Given 01/11/19 1953)     Initial Impression / Assessment and Plan / ED Course  I have reviewed the triage vital signs and the nursing notes.  Pertinent labs & imaging results that were available during my care of the patient were reviewed by me and considered in my medical decision making (see chart for details).        23 yo M here with witnessed GTC activity, now resolved. Has been out of his meds. He is now awake, aox4, and at his mental baseline. No fever or recent infectious symptoms. CBG normal. VSS. Pt refuses work-up which I think is reasonable given that he has known seizures with a known precipitant and is now back to baseline. I confirmed w/ his mother that pt now has his meds filled (she has them on him). Will give a dose of ativan until he can take his meds w/ food, and d/c. Return precautions given.  Final Clinical Impressions(s) / ED Diagnoses   Final diagnoses:  Seizure [facility])    ED Discharge Orders    None       Shaune Pollack, MD 01/11/19 1956

## 2019-01-11 NOTE — ED Notes (Signed)
AVS reviewed with pt. Pt verbalizes understanding. Pt denies pain. Pt escorted to mothers car via wheelchair.

## 2019-01-11 NOTE — ED Triage Notes (Signed)
Per EMS, Pt had a seizure, unknown time or length. Pt called EMS at 1730, pt A&O 4x when EMS arrived, no neurological changes noted. HTX of cerebral palsy, and seizures.

## 2019-01-11 NOTE — ED Notes (Signed)
Patient is AOx4 and ambulatory however patient does not want to stay and mother of patient does not want patient to stay. Patient is within right mind and can make decisions for self. RN spoke with ED Provider and RN will attain vitals, provide medication, and discharge patient.

## 2019-01-11 NOTE — Discharge Instructions (Addendum)
TAKE YOUR TEGRETOL AS SOON AS YOU GET HOME  MAKE SURE YOU DO NOT MISS ANY FURTHER DOSES  YOU'VE BEEN GIVEN A DOSE OF ATIVAN HERE; DO NOT DRINK ANY ALCOHOL TONIGHT

## 2019-06-23 ENCOUNTER — Other Ambulatory Visit: Payer: Self-pay

## 2019-06-23 ENCOUNTER — Emergency Department (HOSPITAL_COMMUNITY)
Admission: EM | Admit: 2019-06-23 | Discharge: 2019-06-23 | Disposition: A | Discharge status: Home / Self Care | Payer: Medicaid Other | Attending: Emergency Medicine | Admitting: Emergency Medicine

## 2019-06-23 ENCOUNTER — Encounter (HOSPITAL_COMMUNITY): Payer: Self-pay | Admitting: Emergency Medicine

## 2019-06-23 DIAGNOSIS — R21 Rash and other nonspecific skin eruption: Secondary | ICD-10-CM | POA: Diagnosis present

## 2019-06-23 DIAGNOSIS — L309 Dermatitis, unspecified: Secondary | ICD-10-CM | POA: Diagnosis not present

## 2019-06-23 MED ORDER — PREDNISONE 20 MG PO TABS: 40.0000 mg | ORAL_TABLET | Freq: Every day | ORAL | 0 refills | Status: AC

## 2019-06-23 MED ORDER — PREDNISONE 20 MG PO TABS
60.0000 mg | ORAL_TABLET | Freq: Once | ORAL | Status: AC
  Administered 2019-06-23: 60 mg via ORAL
  Filled 2019-06-23: qty 3

## 2019-06-23 MED ORDER — CEPHALEXIN 500 MG PO CAPS: 500.0000 mg | ORAL_CAPSULE | Freq: Three times a day (TID) | ORAL | 0 refills | Status: AC

## 2019-06-23 NOTE — ED Provider Notes (Signed)
[facility] Emergency Department Provider Note MRN:  [id number]  Arrival date & time: 06/23/19     Chief Complaint   rash   History of Present Illness   Roberto Manning is a 23 y.o. year-old male with a history of cerebral palsy, seizures presenting to the ED with chief complaint of rash.  Rash began on the lobe of the right ear 2 days ago.  Woke up with diffuse spreading of the rash to the face, neck, torso, arms.  Denies fever, no cough, no chest pain, shortness of breath, no abdominal pain.  Rash has become painful where it has been scratched.  Denies any new medications, no vomiting, no diarrhea, no new soaps or detergents, no exposure to poison ivy or oak.  Review of Systems  A complete 10 system review of systems was obtained and all systems are negative except as noted in the HPI and PMH.   Patient's Health History    Past Medical History:  Diagnosis Date  . ADHD   . Cerebral palsy ([place])   . Seizures ([place])     Past Surgical History:  Procedure Laterality Date  . HIP PINNING      No family history on file.  Social History   Socioeconomic History  . Marital status: Single    Spouse name: Not on file  . Number of children: Not on file  . Years of education: Not on file  . Highest education level: Not on file  Occupational History  . Not on file  Social Needs  . Financial resource strain: Not on file  . Food insecurity    Worry: Not on file    Inability: Not on file  . Transportation needs    Medical: Not on file    Non-medical: Not on file  Tobacco Use  . Smoking status: Never Smoker  . Smokeless tobacco: Never Used  Substance and Sexual Activity  . Alcohol use: No  . Drug use: No  . Sexual activity: Not on file  Lifestyle  . Physical activity    Days per week: Not on file    Minutes per session: Not on file  . Stress: Not on file  Relationships  . Social Herbalist on phone: Not on file    Gets together: Not on file     Attends religious service: Not on file    Active member of club or organization: Not on file    Attends meetings of clubs or organizations: Not on file    Relationship status: Not on file  . Intimate partner violence    Fear of current or ex partner: Not on file    Emotionally abused: Not on file    Physically abused: Not on file    Forced sexual activity: Not on file  Other Topics Concern  . Not on file  Social History Narrative  . Not on file     Physical Exam  Vital Signs and Nursing Notes reviewed Vitals:   06/23/19 0755 06/23/19 0757  BP: 127/82   Pulse: 94   Resp: 18   Temp:  99 F (37.2 C)  SpO2: 100%     CONSTITUTIONAL: Well-appearing, NAD NEURO:  Alert and oriented x 3, no focal deficits EYES:  eyes equal and reactive ENT/NECK:  no LAD, no JVD CARDIO: Regular rate, well-perfused, normal S1 and S2 PULM:  CTAB no wheezing or rhonchi GI/GU:  normal bowel sounds, non-distended, non-tender MSK/SPINE:  No gross deformities,  no edema SKIN: Diffuse scaling papular rash to the ears, face, torso, arms; there is edema and tenderness palpation to an excoriated area of the left earlobe PSYCH:  Appropriate speech and behavior  Diagnostic and Interventional Summary    EKG Interpretation  Date/Time:    Ventricular Rate:    PR Interval:    QRS Duration:   QT Interval:    QTC Calculation:   R Axis:     Text Interpretation:        Labs Reviewed - No data to display  No orders to display    Medications  predniSONE (DELTASONE) tablet 60 mg (has no administration in time range)     Procedures  /  Critical Care Procedures  ED Course and Medical Decision Making  I have reviewed the triage vital signs and the nursing notes.  Pertinent labs & imaging results that were available during my care of the patient were reviewed by me and considered in my medical decision making (see below for details).  Suspect allergic dermatitis.  Dry skin dermatitis also possible.   No mucosal involvement, no new medications, nothing to suggest SJS.  Normal vital signs, no issues with breathing, nothing to suggest anaphylaxis.  Viral etiology also possible but no other symptoms to suggest it.  I suspect a secondary bacterial cellulitis to the left earlobe, will cover with Keflex.  Will provide burst of prednisone.  Patient has good follow-up with dermatology tomorrow.  Appropriate for discharge.  Elmer Sow. Pilar Plate, MD [facility] Emergency Medicine [facility] [email or web address]  Final Clinical Impressions(s) / ED Diagnoses     ICD-10-CM   1. Dermatitis  L30.9     ED Discharge Orders         Ordered    predniSONE (DELTASONE) 20 MG tablet  Daily     06/23/19 0816    cephALEXin (KEFLEX) 500 MG capsule  3 times daily     06/23/19 0816          Discharge Instructions Discussed with and Provided to Patient:   Discharge Instructions     You were evaluated in the Emergency Department and after careful evaluation, we did not find any emergent condition requiring admission or further testing in the hospital.  Your exam/testing today is overall reassuring.  Your symptoms seem to be due to a dermatitis.  Please take the prednisone steroid medication as well as the Keflex antibiotic as directed.  We also recommend Benadryl over-the-counter as needed for symptoms.  The dermatitis or inflammation of the skin could be related to dry skin.  We also recommend application of a daily skin moisturizer.  Please return to the Emergency Department if you experience any worsening of your condition.  We encourage you to follow up with a primary care provider.  Thank you for allowing [place] to be a part of your care.      Sabas Sous, MD 06/23/19 [phone]

## 2019-06-23 NOTE — ED Triage Notes (Signed)
Pt c/o rash that has gotten worse over the past 2 days. Started on right ear and now spread down torso. Pt states that it hurts.

## 2019-06-23 NOTE — Discharge Instructions (Addendum)
You were evaluated in the Emergency Department and after careful evaluation, we did not find any emergent condition requiring admission or further testing in the hospital.  Your exam/testing today is overall reassuring.  Your symptoms seem to be due to a dermatitis.  Please take the prednisone steroid medication as well as the Keflex antibiotic as directed.  We also recommend Benadryl over-the-counter as needed for symptoms.  The dermatitis or inflammation of the skin could be related to dry skin.  We also recommend application of a daily skin moisturizer.  Please return to the Emergency Department if you experience any worsening of your condition.  We encourage you to follow up with a primary care provider.  Thank you for allowing [place] to be a part of your care.

## 2019-08-21 ENCOUNTER — Emergency Department (HOSPITAL_COMMUNITY)
Admission: EM | Admit: 2019-08-21 | Discharge: 2019-08-21 | Disposition: A | Discharge status: Home / Self Care | Payer: Medicaid Other | Attending: Emergency Medicine | Admitting: Emergency Medicine

## 2019-08-21 DIAGNOSIS — Z79899 Other long term (current) drug therapy: Secondary | ICD-10-CM | POA: Insufficient documentation

## 2019-08-21 DIAGNOSIS — R569 Unspecified convulsions: Secondary | ICD-10-CM | POA: Diagnosis not present

## 2019-08-21 LAB — CBC WITH DIFFERENTIAL/PLATELET
Abs Immature Granulocytes: 0.01 10*3/uL (ref 0.00–0.07)
Basophils Absolute: 0 10*3/uL (ref 0.0–0.1)
Basophils Relative: 0 %
Eosinophils Absolute: 0.1 10*3/uL (ref 0.0–0.5)
Eosinophils Relative: 4 %
HCT: 45.2 % (ref 39.0–52.0)
Hemoglobin: 15 g/dL (ref 13.0–17.0)
Immature Granulocytes: 0 %
Lymphocytes Relative: 23 %
Lymphs Abs: 0.9 10*3/uL (ref 0.7–4.0)
MCH: 32.2 pg (ref 26.0–34.0)
MCHC: 33.2 g/dL (ref 30.0–36.0)
MCV: 97 fL (ref 80.0–100.0)
Monocytes Absolute: 0.4 10*3/uL (ref 0.1–1.0)
Monocytes Relative: 11 %
Neutro Abs: 2.3 10*3/uL (ref 1.7–7.7)
Neutrophils Relative %: 62 %
Platelets: 165 10*3/uL (ref 150–400)
RBC: 4.66 MIL/uL (ref 4.22–5.81)
RDW: 14.2 % (ref 11.5–15.5)
WBC: 3.7 10*3/uL — ABNORMAL LOW (ref 4.0–10.5)
nRBC: 0 % (ref 0.0–0.2)

## 2019-08-21 LAB — COMPREHENSIVE METABOLIC PANEL
ALT: 21 U/L (ref 0–44)
AST: 30 U/L (ref 15–41)
Albumin: 4.1 g/dL (ref 3.5–5.0)
Alkaline Phosphatase: 75 U/L (ref 38–126)
Anion gap: 11 (ref 5–15)
BUN: 14 mg/dL (ref 6–20)
CO2: 22 mmol/L (ref 22–32)
Calcium: 9.6 mg/dL (ref 8.9–10.3)
Chloride: 106 mmol/L (ref 98–111)
Creatinine, Ser: 0.98 mg/dL (ref 0.61–1.24)
GFR calc Af Amer: >60 mL/min (ref >60)
GFR calc non Af Amer: >60 mL/min (ref >60)
Glucose, Bld: 106 mg/dL — ABNORMAL HIGH (ref 70–99)
Potassium: 4.8 mmol/L (ref 3.5–5.1)
Sodium: 139 mmol/L (ref 135–145)
Total Bilirubin: 0.4 mg/dL (ref 0.3–1.2)
Total Protein: 7.2 g/dL (ref 6.5–8.1)

## 2019-08-21 LAB — URINALYSIS, ROUTINE W REFLEX MICROSCOPIC
Bilirubin Urine: NEGATIVE
Glucose, UA: NEGATIVE mg/dL
Hgb urine dipstick: NEGATIVE
Ketones, ur: NEGATIVE mg/dL
Leukocytes,Ua: NEGATIVE
Nitrite: NEGATIVE
Protein, ur: NEGATIVE mg/dL
Specific Gravity, Urine: 1.017 (ref 1.005–1.030)
pH: 6 (ref 5.0–8.0)

## 2019-08-21 LAB — RAPID URINE DRUG SCREEN, HOSP PERFORMED
Amphetamines: NOT DETECTED
Barbiturates: NOT DETECTED
Benzodiazepines: NOT DETECTED
Cocaine: NOT DETECTED
Opiates: NOT DETECTED
Tetrahydrocannabinol: NOT DETECTED

## 2019-08-21 LAB — CARBAMAZEPINE LEVEL, TOTAL: Carbamazepine Lvl: <2 ug/mL — ABNORMAL LOW (ref 4.0–12.0)

## 2019-08-21 MED ORDER — SODIUM CHLORIDE 0.9 % IV BOLUS
1000.0000 mL | Freq: Once | INTRAVENOUS | Status: AC
  Administered 2019-08-21: 1000 mL via INTRAVENOUS

## 2019-08-21 MED ORDER — CARBAMAZEPINE 200 MG PO TABS
200.0000 mg | ORAL_TABLET | Freq: Once | ORAL | Status: AC
  Administered 2019-08-21: 12:00:00 200 mg via ORAL
  Filled 2019-08-21: qty 1

## 2019-08-21 NOTE — ED Notes (Signed)
Pt d/c home per MD order. Discharge summary reviewed, verbalizes understanding. Reports mother is discharge ride home. Off unit in WC with NT.

## 2019-08-21 NOTE — ED Provider Notes (Signed)
[facility] EMERGENCY DEPARTMENT Provider Note   CSN: [id number] Arrival date & time: 08/21/19  0844     History Chief Complaint  Patient presents with  . Seizures    Roberto Manning is a 24 y.o. male with history of ADHD, cerebral palsy with chronic left hemiplegia, seizures presents for evaluation of acute onset, resolved witnessed seizure-like activity.  Per triage note and EMS patient had 10 minutes of grand mal seizure-like activity lasting around 30 minutes.  The patient reports no memory of the seizure but is back to baseline.  He denies any pain including headache, chest pain, neck pain, abdominal pain, or low back pain.  No intraoral injury.  Denies shortness of breath, numbness or weakness of the extremities aside from his baseline left hemiplegia, or vision changes.  He reports compliance with his medications.  Ambulates with a walker at baseline.  Denies cigarette smoking, alcohol or drug use.  Per chart review using care everywhere, patient is currently on oxcarbazepine 300 mg tablets twice daily.  He follows with neurology at [facility].  I spoke with the patient's mother on the phone.  She reports that she is unsure if he did take his medication this morning and thinks that he may have had a seizure as a result of not taking it.  She notes that she lays out his pills every morning and thinks that he was about to take the medication when he had the seizure.  Reports grand mal tonic-clonic seizure-like activity for 10 minutes and afterwards became very sleepy.  She reports that she tried to keep him awake while waiting for EMS to arrive.  She expresses frustration that his neurologist at [facility] has not been able to identify a cause of his seizures and would like to take him to a new neurologist for a "second opinion".   The history is provided by the patient.       Past Medical History:  Diagnosis Date  . ADHD   . Cerebral palsy (HCC)   . Seizures  [facility])     Patient Active Problem List   Diagnosis Date Noted  . Seizures (HCC) 06/06/2017    Past Surgical History:  Procedure Laterality Date  . HIP PINNING         No family history on file.  Social History   Tobacco Use  . Smoking status: Never Smoker  . Smokeless tobacco: Never Used  Substance Use Topics  . Alcohol use: No  . Drug use: No    Home Medications Prior to Admission medications   Medication Sig Start Date End Date Taking? Authorizing Provider  carbamazepine (TEGRETOL XR) 200 MG 12 hr tablet Take 200 mg by mouth 2 (two) times daily.    [provider]    Allergies    Patient has no known allergies.  Review of Systems   Review of Systems  Constitutional: Negative for chills and fever.  Eyes: Negative for photophobia and visual disturbance.  Respiratory: Negative for shortness of breath.   Cardiovascular: Negative for chest pain.  Gastrointestinal: Negative for abdominal pain, nausea and vomiting.  Neurological: Positive for seizures and weakness (left sided, chronic). Negative for numbness and headaches.  All other systems reviewed and are negative.   Physical Exam Updated Vital Signs BP 126/85   Pulse 78   Temp 98.2 F (36.8 C) (Oral)   Resp 16   Ht 5\' 5"  (1.651 m)   Wt 68 kg   SpO2 100%  BMI 24.96 kg/m   Physical Exam Vitals and nursing note reviewed.  Constitutional:      General: He is not in acute distress.    Appearance: He is well-developed.  HENT:     Head: Normocephalic and atraumatic.     Comments: No Battle's signs, no raccoon's eyes, no rhinorrhea. No hemotympanum. No tenderness to palpation of the face or skull. No deformity, crepitus, or swelling noted.  Eyes:     General:        Right eye: No discharge.        Left eye: No discharge.     Conjunctiva/sclera: Conjunctivae normal.     Pupils: Pupils are equal, round, and reactive to light.  Neck:     Vascular: No JVD.     Trachea: No tracheal deviation.      Comments: No midline spine TTP, no paraspinal muscle tenderness, no deformity, crepitus, or step-off noted  Cardiovascular:     Rate and Rhythm: Normal rate and regular rhythm.     Pulses: Normal pulses.     Heart sounds: Normal heart sounds.  Pulmonary:     Effort: Pulmonary effort is normal.     Breath sounds: Normal breath sounds.  Abdominal:     General: Abdomen is flat. Bowel sounds are normal. There is no distension.     Palpations: Abdomen is soft.     Tenderness: There is no abdominal tenderness. There is no guarding or rebound.  Musculoskeletal:        General: No tenderness.     Cervical back: Normal range of motion and neck supple. No rigidity.     Comments: No midline spine TTP, no paraspinal muscle tenderness, no deformity, crepitus, or step-off noted   Skin:    General: Skin is warm and dry.     Findings: No erythema.  Neurological:     Mental Status: He is alert and oriented to person, place, and time.     Cranial Nerves: No cranial nerve deficit.     Sensory: No sensory deficit.     Comments: Mental Status:  Alert, thought content appropriate, able to give a coherent history. Speech fluent without evidence of aphasia. Able to follow 2 step commands without difficulty.  Cranial Nerves:  II:  Peripheral visual fields grossly normal, pupils equal, round, reactive to light III,IV, VI: ptosis not present, extra-ocular motions intact bilaterally  V,VII: smile symmetric, facial light touch sensation equal VIII: hearing grossly normal to voice  X: uvula elevates symmetrically  XI: bilateral shoulder shrug symmetric and strong XII: midline tongue extension without fassiculations Motor:  Decreased strength in the left upper and lower extremity major muscle groups as compared to the right.  5/5 strength on the right.  Able to move all extremities spontaneously. Sensory: light touch normal in all extremities.   Psychiatric:        Behavior: Behavior normal.     ED  Results / Procedures / Treatments   Labs (all labs ordered are listed, but only abnormal results are displayed) Labs Reviewed  COMPREHENSIVE METABOLIC PANEL - Abnormal; Notable for the following components:      Result Value   Glucose, Bld 106 (*)    All other components within normal limits  CBC WITH DIFFERENTIAL/PLATELET - Abnormal; Notable for the following components:   WBC 3.7 (*)    All other components within normal limits  CARBAMAZEPINE LEVEL, TOTAL - Abnormal; Notable for the following components:   Carbamazepine Lvl <2.0 (*)  All other components within normal limits  RAPID URINE DRUG SCREEN, HOSP PERFORMED  URINALYSIS, ROUTINE W REFLEX MICROSCOPIC    EKG EKG Interpretation  Date/Time:  Friday August 21 2019 08:53:41 EST Ventricular Rate:  89 PR Interval:    QRS Duration: 90 QT Interval:  347 QTC Calculation: 423 R Axis:   4 Text Interpretation: Sinus rhythm ST elev, probable normal early repol pattern No significant change since last tracing Confirmed by Linwood Dibbles [phone]) on 08/21/2019 8:59:18 AM   Radiology No results found.  Procedures Procedures (including critical care time)  Medications Ordered in ED Medications  sodium chloride 0.9 % bolus 1,000 mL (0 mLs Intravenous Stopped 08/21/19 1100)  carbamazepine (TEGRETOL) tablet 200 mg (200 mg Oral Given 08/21/19 1153)    ED Course  I have reviewed the triage vital signs and the nursing notes.  Pertinent labs & imaging results that were available during my care of the patient were reviewed by me and considered in my medical decision making (see chart for details).    MDM Rules/Calculators/A&P                      Patient presenting for evaluation after witnessed seizure-like activity with mother.  Patient is afebrile, vital signs are stable.  He is nontoxic in appearance.  Neurologic examination is at baseline with no new focal neurologic deficits.  Chronic left hemiplegia.  He is alert, mental status is at  baseline.  No signs of serious head injury, intraoral injury, no urinary incontinence.  Lab work reviewed by me shows no leukocytosis, no anemia, no metabolic derangements, no renal insufficiency.  His UDS is negative.  His carbamazepine level is subtherapeutic.  He was given a dose of Tegretol in the ED.  EKG shows no significant changes from last tracing.  On reevaluation he is resting comfortably in no apparent distress, tolerating p.o. food and fluids without difficulty.  Remains alert and oriented.  No fever or meningeal signs to suggest meningitis.  Doubt intracranial hemorrhage, CVA, skull fracture.  Discussed work-up with mother.  He is stable for discharge home with close outpatient follow-up with neurology.  I will give her a referral for neurology within our system as she is interested in establishing care with a new neurologist.  Discussed seizure precautions.  Discussed strict ED return precautions.  Patient and mother verbalized understanding of and agreement with plan and patient stable for discharge home at this time.  Discussed plan of care with Dr. Lynelle Doctor who agrees with assessment and plan at this time. Final Clinical Impression(s) / ED Diagnoses Final diagnoses:  Witnessed seizure-like activity (HCC)    Rx / DC Orders ED Discharge Orders         Ordered    Ambulatory referral to Neurology    Comments: An appointment is requested in approximately: 1 week   08/21/19 1227           Bennye Alm 08/21/19 1246    Linwood Dibbles, MD 08/23/19 2004

## 2019-08-21 NOTE — ED Triage Notes (Signed)
Pt to ED from home via EMS c/o seizure aprox 30 mins ago. Pt has hx of cp and seizures, currently takes trileptal as prescribed. Mother reports pt had a witnessed grand mal like seizure lasting aprox 10 mins, No obvious injuries . Pt denies pain, has no memory of seizure, alert and oriented x 4. No meds given by EMS. #20 r wrist.

## 2019-08-21 NOTE — Discharge Instructions (Addendum)
-   Refrain from climbing higher than 10 feet             - Refrain from taking a tub bath alone and/or leave the door unlocked while in the shower             - Refrain from swimming unsupervised             - Wear a helmet when riding a bike or rollerblades             - Refrain from driving and contact the DMV             - Take reasonable precautions and restrictions with more dangerous activities, such as operating heavy machinery and playing contact sports  Continue taking medications as prescribed. Avoid drugs or alcohol.   Please call your neurologist on Monday to schedule close follow-up and for further recommendations.  I have also referred you to our neurologist in the [facility] system but you can follow-up with whomever you like.  Please return to the emergency department immediately if any concerning signs or symptoms develop such as fevers, persistent vomiting, altered mental status, new weakness, or recurrent seizure.

## 2019-08-21 NOTE — ED Notes (Signed)
collateral information received from pt mother per PA request. Pt takes Tegretol PO BID, last dose this morning prior to seizure. PA made aware.

## 2020-04-20 DIAGNOSIS — F909 Attention-deficit hyperactivity disorder, unspecified type: Secondary | ICD-10-CM | POA: Insufficient documentation

## 2020-09-10 IMAGING — CT CT HEAD W/O CM
4 series · 15 of 47 positions shown, 17 images · non-contrast
Comparison: 06/06/2017.

CLINICAL DATA: Evaluate seizures.

EXAM:
CT HEAD WITHOUT CONTRAST
TECHNIQUE: Contiguous axial images were obtained from the base of the skull
through the vertex without intravenous contrast.

[Series 3: head wo · axial · 0.46mm/px · z∈[+1346,+1466]mm · 7 of 33 slices shown, 9 images]
[im 5/33  brain]
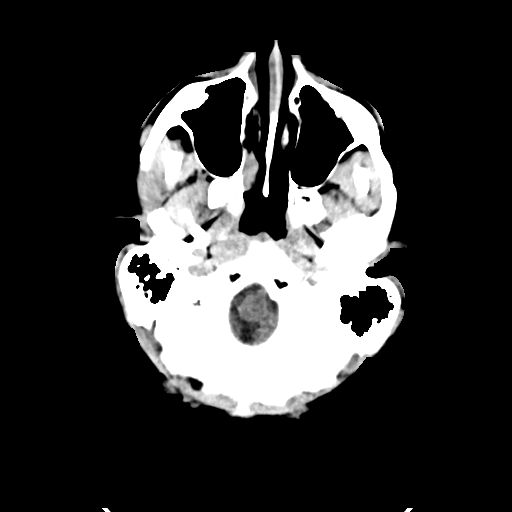
[im 5/33  bone]
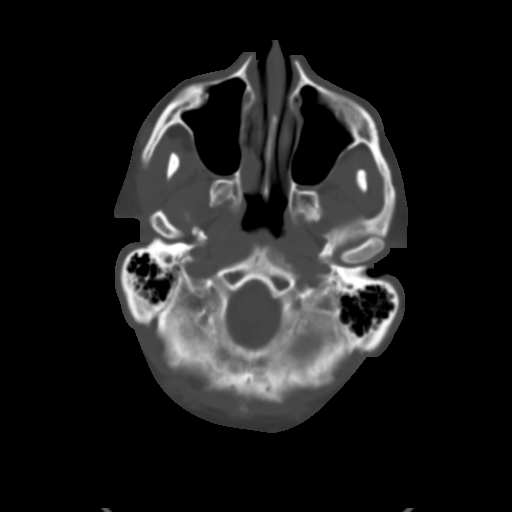
[im 9/33  brain]
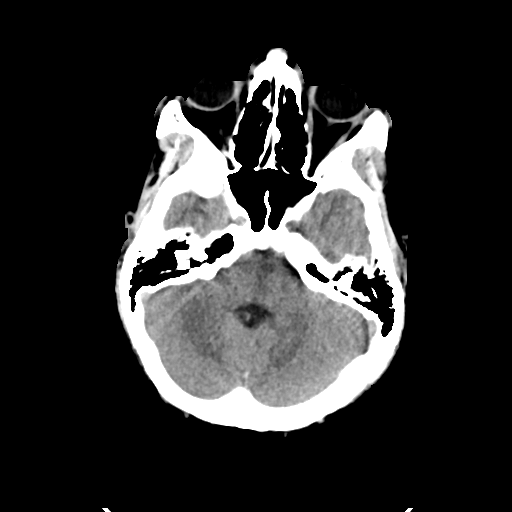
[im 13/33  brain]
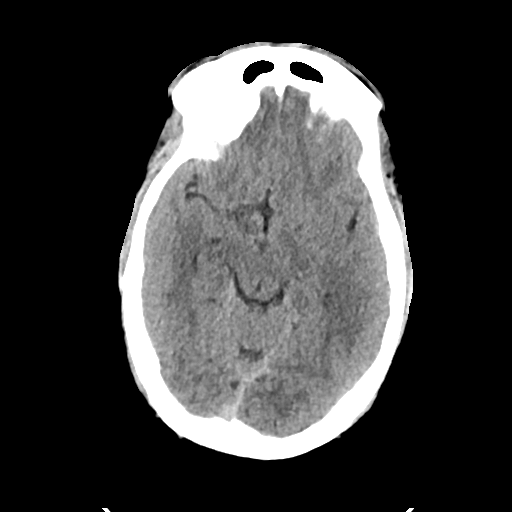
[im 17/33  brain]
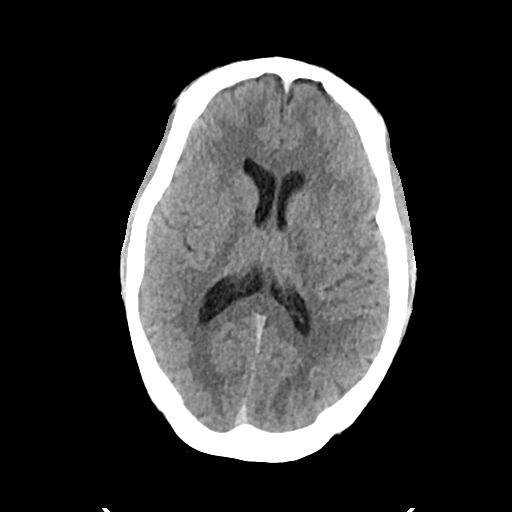
[im 21/33  brain]
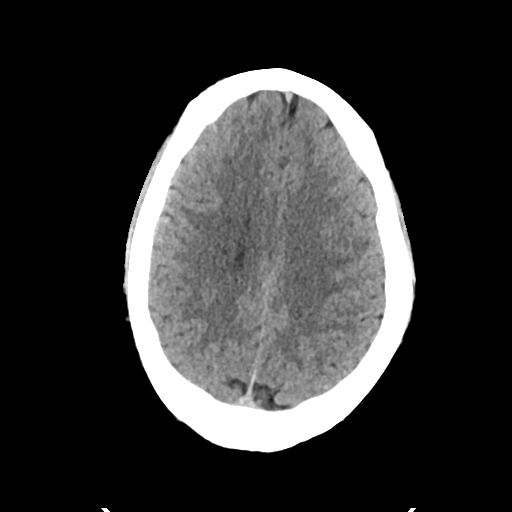
[im 21/33  bone]
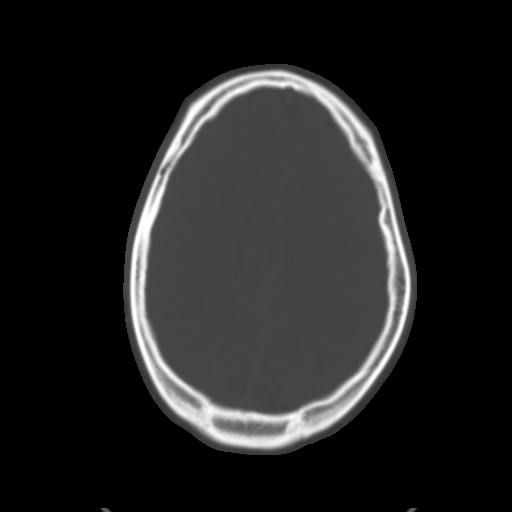
[im 25/33  brain]
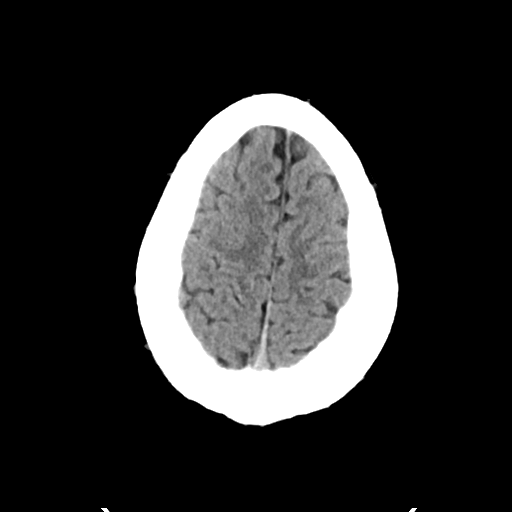
[im 29/33  brain]
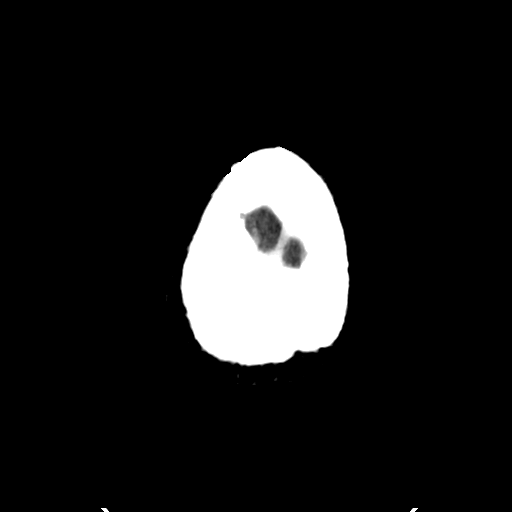

[Series 4: head bone · axial · 0.46mm/px · z∈[+1342,+1358]mm · 2 of 83 slices shown]
[im 9/83  bone]
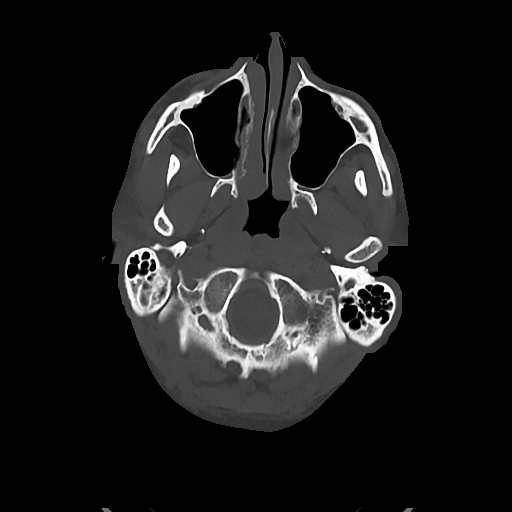
[im 17/83  bone]
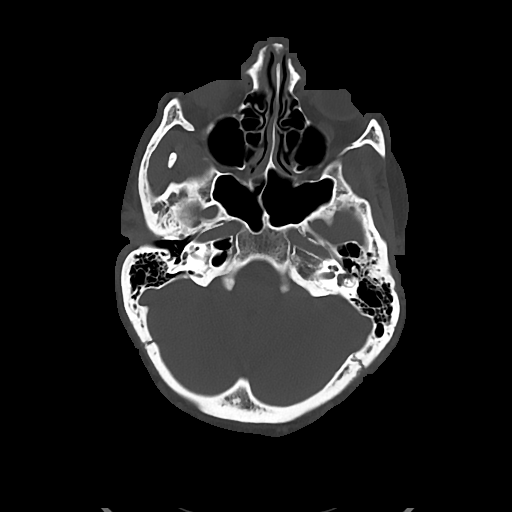

[Series 5: cor soft · coronal · 0.32mm/px · 3 of 72 slices shown]
[im 24/72  brain]
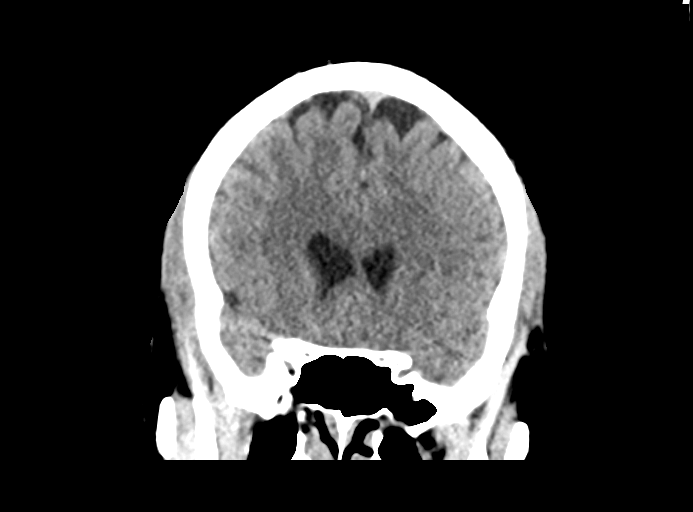
[im 32/72  brain]
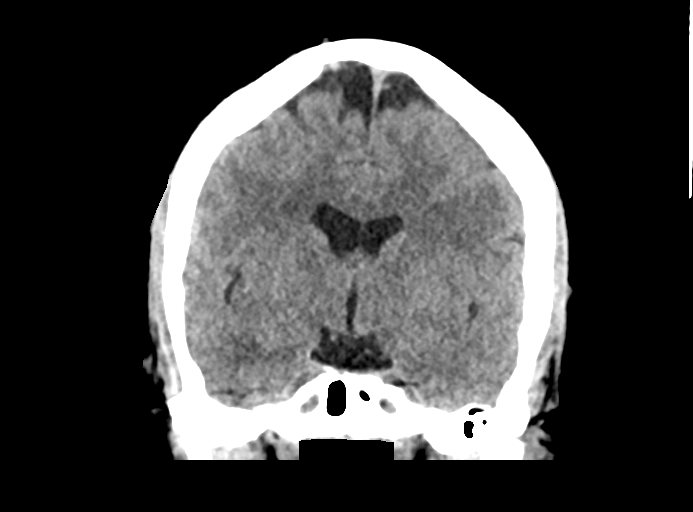
[im 40/72  brain]
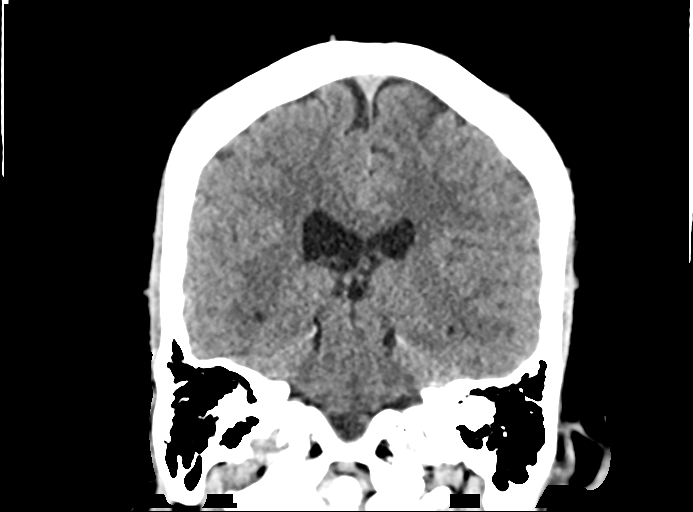

[Series 6: sag soft · sagittal · 0.32mm/px · 3 of 67 slices shown]
[im 23/67  brain]
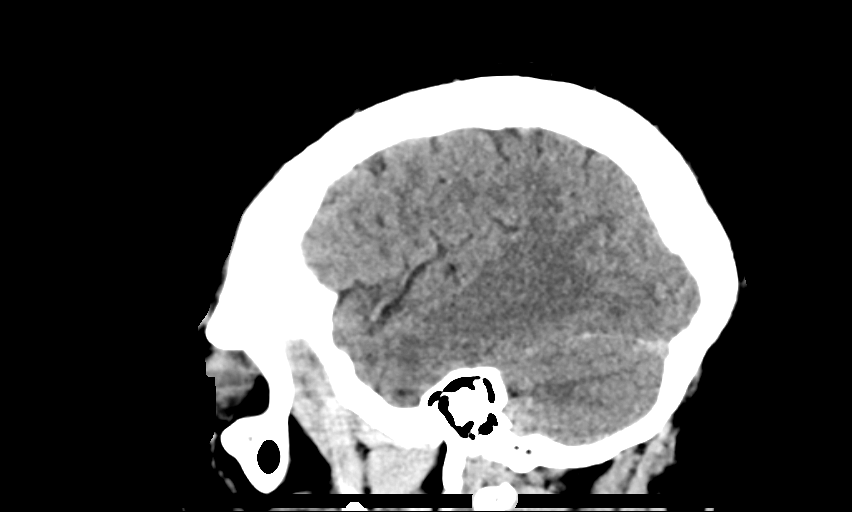
[im 34/67  brain]
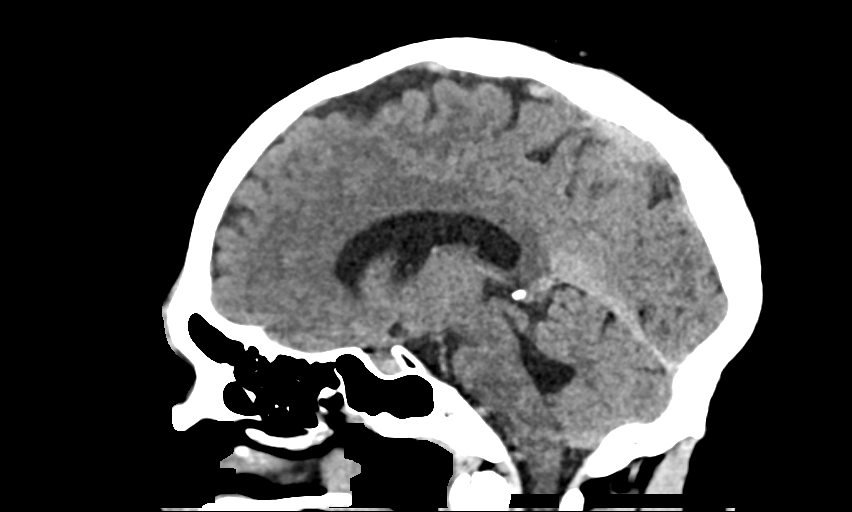
[im 45/67  brain]
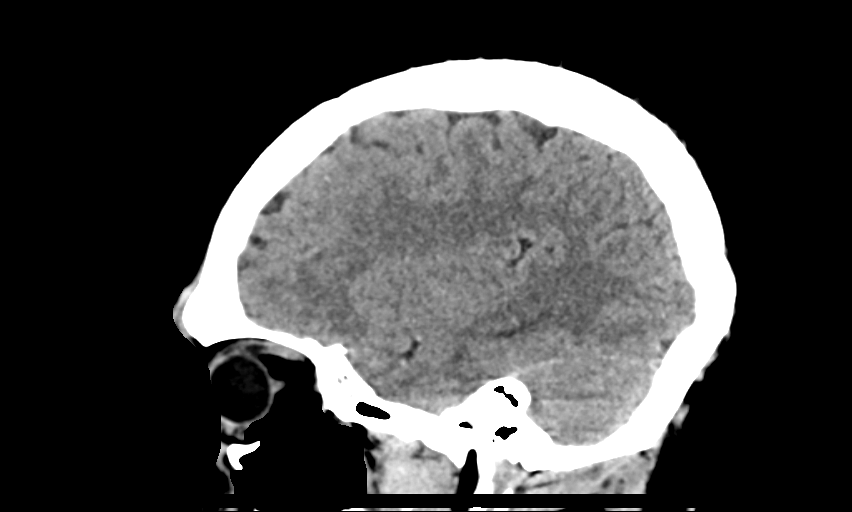

[15 of 47 positions shown; findings below may reference images not displayed]

FINDINGS: Brain: No evidence of acute infarction, hemorrhage, hydrocephalus,
extra-axial collection or mass lesion/mass effect. Normal for age
cerebral volume. Mildly prominent white matter
hypoattenuation/gray-white junction, appears consistent with prior
imaging, and normal for this patient.

Vascular: No hyperdense vessel or unexpected calcification.

Skull: Normal. Negative for fracture or focal lesion.

Sinuses/Orbits: No acute finding.

Other: None.
IMPRESSION: Negative exam with no change from priors.

## 2023-01-18 ENCOUNTER — Other Ambulatory Visit: Payer: Self-pay | Admitting: Podiatry

## 2023-01-18 ENCOUNTER — Ambulatory Visit (INDEPENDENT_AMBULATORY_CARE_PROVIDER_SITE_OTHER): Payer: Medicaid Other | Admitting: Podiatry

## 2023-01-18 ENCOUNTER — Encounter: Payer: Self-pay | Admitting: Podiatry

## 2023-01-18 DIAGNOSIS — M79675 Pain in left toe(s): Secondary | ICD-10-CM

## 2023-01-18 DIAGNOSIS — M79674 Pain in right toe(s): Secondary | ICD-10-CM | POA: Diagnosis not present

## 2023-01-18 DIAGNOSIS — B351 Tinea unguium: Secondary | ICD-10-CM | POA: Diagnosis not present

## 2023-01-18 DIAGNOSIS — Z79899 Other long term (current) drug therapy: Secondary | ICD-10-CM

## 2023-01-18 NOTE — Progress Notes (Signed)
  Subjective:  Patient ID: Roberto Manning, male    DOB: March 25, 1996,  MRN: [id number]  Chief Complaint  Patient presents with   Nail Problem   27 y.o. male returns for the above complaint.  Patient presents with thickened elongated dystrophic mycotic toenails x 10 mild pain on palpation of  Objective:  There were no vitals filed for this visit. Podiatric Exam: Vascular: dorsalis pedis and posterior tibial pulses are palpable bilateral. Capillary return is immediate. Temperature gradient is WNL. Skin turgor WNL  Sensorium: Normal Semmes Weinstein monofilament test. Normal tactile sensation bilaterally. Nail Exam: Pt has thick disfigured discolored nails with subungual debris noted bilateral entire nail hallux through fifth toenails.  Pain on palpation to the nails. Ulcer Exam: There is no evidence of ulcer or pre-ulcerative changes or infection. Orthopedic Exam: Muscle tone and strength are WNL. No limitations in general ROM. No crepitus or effusions noted.  Skin: No Porokeratosis. No infection or ulcers    Assessment & Plan:   1. Long-term use of high-risk medication   2. Nail fungus   3. Onychomycosis due to dermatophyte   4. Pain due to onychomycosis of toenails of both feet     Patient was evaluated and treated and all questions answered.  Onychomycosis toenails x 10. -Educated the patient on the etiology of onychomycosis and various treatment options associated with improving the fungal load.  I explained to the patient that there is 3 treatment options available to treat the onychomycosis including topical, p.o., laser treatment.  Patient elected to undergo p.o. options with Lamisil/terbinafine therapy.  In order for me to start the medication therapy, I explained to the patient the importance of evaluating the liver and obtaining the liver function test.  Once the liver function test comes back normal I will start him on 71-month course of Lamisil therapy.  Patient understood all  risk and would like to proceed with Lamisil therapy.  I have asked the patient to immediately stop the Lamisil therapy if she has any reactions to it and call the office or go to the emergency room right away.  Patient states understanding   Onychomycosis with pain  -Nails palliatively debrided as below. -Educated on self-care  Procedure: Nail Debridement Rationale: pain  Type of Debridement: manual, sharp debridement. Instrumentation: Nail nipper, rotary burr. Number of Nails: 10  Procedures and Treatment: Consent by patient was obtained for treatment procedures. The patient understoo  d the discussion of treatment and procedures well. All questions were answered thoroughly reviewed. Debridement of mycotic and hypertrophic toenails, 1 through 5 bilateral and clearing of subungual debris. No ulceration, no infection noted.  Return Visit-Office Procedure: Patient instructed to return to the office for a follow up visit 3 months for continued evaluation and treatment.  Nicholes Rough, DPM    No follow-ups on file.

## 2023-01-19 LAB — HEPATIC FUNCTION PANEL
ALT: 13 IU/L (ref 0–44)
AST: 23 IU/L (ref 0–40)
Albumin: 4.7 g/dL (ref 4.3–5.2)
Alkaline Phosphatase: 85 IU/L (ref 44–121)
Bilirubin Total: 0.3 mg/dL (ref 0.0–1.2)
Bilirubin, Direct: <0.1 mg/dL (ref 0.00–0.40)
Total Protein: 7.8 g/dL (ref 6.0–8.5)

## 2023-01-22 MED ORDER — TERBINAFINE HCL 250 MG PO TABS: 250.0000 mg | ORAL_TABLET | Freq: Every day | ORAL | 0 refills | Status: AC

## 2023-01-22 NOTE — Addendum Note (Signed)
Addended by: Florena Kozma on: 01/22/2023 08:13 AM   Modules accepted: Orders  

## 2023-02-22 ENCOUNTER — Ambulatory Visit: Payer: MEDICAID | Attending: Physical Medicine and Rehabilitation | Admitting: Physical Therapy

## 2023-02-22 ENCOUNTER — Telehealth: Payer: Self-pay | Admitting: Physical Therapy

## 2023-02-22 DIAGNOSIS — M79605 Pain in left leg: Secondary | ICD-10-CM | POA: Insufficient documentation

## 2023-02-22 DIAGNOSIS — R278 Other lack of coordination: Secondary | ICD-10-CM | POA: Diagnosis present

## 2023-02-22 DIAGNOSIS — R2689 Other abnormalities of gait and mobility: Secondary | ICD-10-CM | POA: Insufficient documentation

## 2023-02-22 DIAGNOSIS — M6281 Muscle weakness (generalized): Secondary | ICD-10-CM | POA: Diagnosis present

## 2023-02-22 DIAGNOSIS — M5459 Other low back pain: Secondary | ICD-10-CM | POA: Diagnosis present

## 2023-02-22 DIAGNOSIS — R2681 Unsteadiness on feet: Secondary | ICD-10-CM | POA: Insufficient documentation

## 2023-02-22 NOTE — Therapy (Signed)
OUTPATIENT PHYSICAL THERAPY NEURO EVALUATION   Patient Name: Roberto Manning MRN: [id number] DOB:1996-07-30, 27 y.o., male Today's Date: 02/22/2023   PCP: Benetta Spar, MD REFERRING PROVIDER: Merrily Brittle, DO  END OF SESSION:  PT End of Session - 02/22/23 0933     Visit Number 1    Number of Visits 7   with eval   Date for PT Re-Evaluation 05/03/23   to allow for scheduling delays   Authorization Type [employer]    PT Start Time 0930    PT Stop Time 1005   eval   PT Time Calculation (min) 35 min    Activity Tolerance Patient tolerated treatment well    Behavior During Therapy [facility] for tasks assessed/performed;Impulsive             Past Medical History:  Diagnosis Date   ADHD    Cerebral palsy (HCC)    Seizures (HCC)    Past Surgical History:  Procedure Laterality Date   HIP PINNING     Patient Active Problem List   Diagnosis Date Noted   ADHD 04/20/2020   Seizures (HCC) 06/06/2017   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   Neutropenia (HCC) 04/28/2016   Cerebral palsy (HCC) 04/19/2016   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   Seizure disorder (HCC) 07/06/2012   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    ONSET DATE: 01/21/2023  REFERRING DIAG: 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 M54.50 (ICD-10-CM) - Low back pain, unspecified R26.81 (ICD-10-CM) - Unsteadiness on feet  THERAPY DIAG:  Muscle weakness (generalized)  Other abnormalities of gait and mobility  Other lack of coordination  Unsteadiness on feet  Other low back pain  Pain in left leg  Rationale for Evaluation and Treatment: Rehabilitation  SUBJECTIVE:                                                                                                                                                                                             SUBJECTIVE  STATEMENT: Pt reports he has been experiencing tightness in his L leg. He states that he has pain when trying to bend his L knee and has muscle spasms and pain in his low back. Pt is a fair historian and his sister has to frequently correct him regarding statements that he is making. Per family pt has been falling at least 1x/day for the past few months, he did see his PCP regarding these falls and they were unsure why the falls were occurring. Pt states that he gets "overheated" and then  falls, but falls more when he is indoors vs outdoors in the heat. Pt is trying to get a referral to have an EEG done. Pt also wearing a L AFO, states he got this one about 2 months ago.   Pt enjoys playing video games, playing basketball, and running around outside with his nieces and nephews. Pt has had difficulty keeping up and running due to the stiffness in his legs.  Pt accompanied by: self and family member sister  PERTINENT HISTORY: Seizure disorder, ADHD, CP  PAIN:  Are you having pain? Yes: NPRS scale: 9/10 Pain location: L leg and back Pain description: sharp Aggravating factors: when its about to rain, when I get too cold Relieving factors: nothing (has tried OTC meds and mm relaxers)  PRECAUTIONS: Fall and Other: seizures (last seizure was Jan 2024)  WEIGHT BEARING RESTRICTIONS: No  FALLS: Has patient fallen in last 6 months? Yes. Number of falls falls at least once per day to his L side (overheats and falls)  LIVING ENVIRONMENT: Lives with: lives with their family Lives in: House/apartment Stairs: Yes: Internal: 12 steps; on right going up Has following equipment at home: Walker - 2 wheeled  PLOF: Independent with gait and Independent with transfers  PATIENT GOALS: "trying to get myself better" "get my leg strength back so I can get back active"  OBJECTIVE:   DIAGNOSTIC FINDINGS:  Thoracic Spine Xray 01/21/23 FINDINGS: Lower thoracic scoliosis is apex to the left, centered at  approximately T9. On the AP film, the pedicles are intact. No hemivertebra or segmentation anomaly is identified. The upper thoracic spine is obscured on the lateral and swimmer's views due to overlying skeletal and soft tissue structures. Visualized thoracic vertebral body heights and anteroposterior alignment are maintained. No definite fractures are visualized. Spinous processes in the lower thoracic spine are obscured by overlying ribs.  IMPRESSION:  1. Similar lower thoracic scoliosis.  2. No acute fractures are visualized, with limitations as above.  COGNITION: Overall cognitive status: History of cognitive impairments - at baseline   SENSATION: Occasional N/T in LE Light touch WFL  MUSCLE TONE: clonus in BLE   POSTURE: rounded shoulders, forward head, posterior pelvic tilt, and flexed trunk   LOWER EXTREMITY ROM:     Passive  Right eval Left eval  Hip flexion    Hip extension    Hip abduction    Hip adduction    Hip internal rotation    Hip external rotation    Knee flexion    Knee extension  -10 degrees  Ankle dorsiflexion    Ankle plantarflexion    Ankle inversion    Ankle eversion     (Blank rows = not tested)     LOWER EXTREMITY MMT:    MMT Right Eval Left Eval  Hip flexion 5 4  Hip extension    Hip abduction    Hip adduction    Hip internal rotation    Hip external rotation    Knee flexion 5 3  Knee extension 4 3  Ankle dorsiflexion 5 3  Ankle plantarflexion    Ankle inversion    Ankle eversion    (Blank rows = not tested)  BED MOBILITY:  Mod I per pt report  TRANSFERS: Assistive device utilized: None  Sit to stand: Modified independence Stand to sit: Modified independence Chair to chair: Modified independence Floor:  not assessed at eval  GAIT: Gait pattern: decreased hip/knee flexion- Left, decreased ankle dorsiflexion- Left, circumduction- Left, knee flexed in  stance- Left, antalgic, trunk rotated posterior- Right, trunk rotated  posterior- Left, trunk flexed, and wide BOS Distance walked: various clinic distances Assistive device utilized: None Level of assistance: Modified independence Comments: see above  FUNCTIONAL TESTS:   [facility] PT Assessment - 02/22/23 0958       Ambulation/Gait   Gait velocity 32.8 ft over 12 sec = 2.73 ft/sec      Standardized Balance Assessment   Standardized Balance Assessment Timed Up and Go Test;Five Times Sit to Stand    Five times sit to stand comments  20 sec   RUE support on arm of chair     Timed Up and Go Test   TUG Normal TUG    Normal TUG (seconds) 11.3   no AD            PATIENT SURVEYS:  Modified Oswestry 29/50  LEFS 30/80  TODAY'S TREATMENT:                                                                                                                              PT Evaluation    PATIENT EDUCATION: Education details: Eval findings, PT POC Person educated: Patient and sister Education method: Explanation and Demonstration Education comprehension: verbalized understanding, returned demonstration, and needs further education  HOME EXERCISE PROGRAM: To be initiated  GOALS: Goals reviewed with patient? Yes  SHORT TERM GOALS: Target date: 03/22/2023  Pt will be independent with initial HEP for improved strength, balance, transfers and gait. Baseline: Goal status: INITIAL   LONG TERM GOALS: Target date: 04/12/2023    Pt will be independent with final HEP for improved strength, balance, transfers and gait. Baseline:  Goal status: INITIAL  2.  Pt will improve his score to </= 25/50 on the Oswestry to demonstrate decreased disability level Baseline: 29/50 (7/5) Goal status: INITIAL  3.  Pt will improve his score on the LEFS to 35/80 to demonstrate decreased disability level Baseline: 30/80 (7/5) Goal status: INITIAL  4.  Pt will improve 5 x STS to less than or equal to 15 seconds to demonstrate improved functional strength and transfer  efficiency.  Baseline: 20 sec (7/5) Goal status: INITIAL  5.  Pt will improve gait velocity to at least 3.0 ft/sec for improved gait efficiency and performance at mod I level  Baseline: 2.73 ft/sec mod I (7/5) Goal status: INITIAL    ASSESSMENT:  CLINICAL IMPRESSION: Patient is a 27 year old male referred to Neuro OPPT for balance impairments and pain related to his spastic L hemiplegic cerebral palsy.   Pt's PMH is significant for: seizure disorder, ADHD, and CP. The following deficits were present during the exam: decreased LLE strength and ROM, impaired balance, increased disability level, and pain. Based on his 5xSTS score of 20 sec, gait speed of 2.73 ft/sec, gait impairments, and significant fall history, pt is an increased risk for falls. Pt would benefit from skilled PT to address these impairments and functional limitations  to maximize functional mobility independence.   OBJECTIVE IMPAIRMENTS: Abnormal gait, decreased balance, decreased cognition, decreased coordination, decreased knowledge of use of DME, difficulty walking, decreased ROM, decreased strength, increased muscle spasms, impaired flexibility, impaired sensation, impaired tone, impaired UE functional use, improper body mechanics, postural dysfunction, and pain.   ACTIVITY LIMITATIONS: carrying, lifting, bending, standing, squatting, stairs, transfers, bathing, and dressing  PARTICIPATION LIMITATIONS: cleaning, interpersonal relationship, and community activity  PERSONAL FACTORS: Time since onset of injury/illness/exacerbation and 1-2 comorbidities: seizure disorder, ADHD, and CP  are also affecting patient's functional outcome.   REHAB POTENTIAL: Fair chronic history of CP  CLINICAL DECISION MAKING: Stable/uncomplicated  EVALUATION COMPLEXITY: Low  PLAN:  PT FREQUENCY: 1x/week  PT DURATION:  7 total sessions with eval  PLANNED INTERVENTIONS: Therapeutic exercises, Therapeutic activity, Neuromuscular  re-education, Balance training, Gait training, Patient/Family education, Self Care, Joint mobilization, Stair training, Orthotic/Fit training, DME instructions, Dry Needling, Electrical stimulation, Cryotherapy, Moist heat, Taping, Manual therapy, and Re-evaluation  PLAN FOR NEXT SESSION: initiate HEP for L hamstring and hip stretching, LLE strengthening, core stretching and then strengthening/stability, postural stabilization, pt wants to try out [place] crutches, did we get OT referral?  Check all possible CPT codes: 16109 - PT Re-evaluation, 97110- Therapeutic Exercise, [phone]- Neuro Re-education, [phone] - Gait Training, [phone] - Manual Therapy, 97530 - Therapeutic Activities, [phone] - Self Care, [phone] - Electrical stimulation (Manual), [phone] - Orthotic Fit, and [id number] - Aquatic therapy    Check all conditions that are expected to impact treatment: {Conditions expected to impact treatment:Contractures, spasticity or fracture relevant to requested treatment, Neurological condition and/or seizures, and Presence of Medical Equipment   If treatment provided at initial evaluation, no treatment charged due to lack of authorization.        Peter Congo, PT Peter Congo, PT, DPT, CSRS  02/22/2023, 10:08 AM

## 2023-03-08 ENCOUNTER — Encounter: Payer: Self-pay | Admitting: Physical Therapy

## 2023-03-08 ENCOUNTER — Ambulatory Visit: Payer: MEDICAID | Admitting: Physical Therapy

## 2023-03-08 DIAGNOSIS — R2681 Unsteadiness on feet: Secondary | ICD-10-CM

## 2023-03-08 DIAGNOSIS — R2689 Other abnormalities of gait and mobility: Secondary | ICD-10-CM

## 2023-03-08 DIAGNOSIS — M6281 Muscle weakness (generalized): Secondary | ICD-10-CM

## 2023-03-08 DIAGNOSIS — R278 Other lack of coordination: Secondary | ICD-10-CM

## 2023-03-08 DIAGNOSIS — M5459 Other low back pain: Secondary | ICD-10-CM

## 2023-03-08 DIAGNOSIS — M79605 Pain in left leg: Secondary | ICD-10-CM

## 2023-03-08 NOTE — Patient Instructions (Signed)
Access Code: [email or web address] URL: [email or web address] Date: 03/08/2023 Prepared by: Camille Bal  Exercises - Seated Hamstring Stretch  - 1 x daily - 5 x weekly - 1 sets - 2-3 reps - 45 seconds hold - Seated Piriformis Stretch with Trunk Bend  - 1 x daily - 5 x weekly - 1 sets - 2-3 reps - 30-45 seconds hold - Sit to Stand with Resistance Around Legs  - 1 x daily - 5 x weekly - 2 sets - 10 reps - Hooklying Single Knee to Chest Stretch  - 1 x daily - 5 x weekly - 1 sets - 2-3 reps - 45 seconds hold - Supine Bridge with Resistance Band  - 1 x daily - 5 x weekly - 2 sets - 12 reps

## 2023-03-08 NOTE — Therapy (Addendum)
OUTPATIENT PHYSICAL THERAPY NEURO TREATMENT   Patient Name: Roberto Manning MRN: [id number] DOB:02/26/1996, 27 y.o., male Today's Date: 03/08/2023   PCP: Benetta Spar, MD REFERRING PROVIDER: Merrily Brittle, DO  END OF SESSION:  PT End of Session - 03/08/23 0854     Visit Number 2    Number of Visits 7   with eval   Date for PT Re-Evaluation 05/03/23   to allow for scheduling delays   Authorization Type [facility]    PT Start Time [phone]    PT Stop Time 0927    PT Time Calculation (min) 38 min    Equipment Utilized During Treatment --    Activity Tolerance Patient tolerated treatment well    Behavior During Therapy [facility] for tasks assessed/performed;Impulsive             Past Medical History:  Diagnosis Date   ADHD    Cerebral palsy (HCC)    Seizures (HCC)    Past Surgical History:  Procedure Laterality Date   HIP PINNING     Patient Active Problem List   Diagnosis Date Noted   ADHD 04/20/2020   Seizures (HCC) 06/06/2017   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   Neutropenia (HCC) 04/28/2016   Cerebral palsy (HCC) 04/19/2016   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   Seizure disorder (HCC) 07/06/2012   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    ONSET DATE: 01/21/2023  REFERRING DIAG: 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  He also brings his hand splint stating he needs a piece added to it.  Pt accompanied by: self and family member sister dropped him off  PERTINENT HISTORY: Seizure disorder, ADHD, CP  PAIN:  Are you having pain? Yes: NPRS scale: 5/10 Pain location: L leg and back Pain description: sharp Aggravating factors: when its about to rain, when I get too cold Relieving factors: nothing (has tried OTC meds and mm relaxers)  PRECAUTIONS: Fall and Other: seizures (last seizure was Jan 2024)  WEIGHT BEARING RESTRICTIONS: No  FALLS: Has patient fallen in last 6 months? Yes. Number of falls falls at least once per day to his L side (overheats and falls)  LIVING ENVIRONMENT: Lives with: lives with their family Lives in: House/apartment Stairs: Yes: Internal: 12 steps; on right going up Has following equipment at home: Walker - 2 wheeled  PLOF: Independent with gait and Independent with transfers  PATIENT GOALS: "trying to get myself better" "get my leg strength back so I can get back active"  OBJECTIVE:   DIAGNOSTIC FINDINGS:  Thoracic Spine Xray 01/21/23 FINDINGS: Lower thoracic scoliosis is apex to the left, centered at approximately T9. On the AP film, the pedicles are intact. No hemivertebra or segmentation anomaly is identified. The upper thoracic spine is obscured on the lateral and swimmer's views due to overlying skeletal and soft tissue structures. Visualized thoracic vertebral body heights and anteroposterior alignment are maintained. No definite fractures are visualized. Spinous processes in the lower thoracic spine are obscured by overlying ribs.  IMPRESSION:  1. Similar lower thoracic scoliosis.  2. No acute fractures are visualized, with limitations as above.  COGNITION: Overall cognitive status: History of cognitive impairments - at baseline   SENSATION: Occasional N/T in LE Light  touch WFL  MUSCLE TONE: clonus in BLE   POSTURE: rounded shoulders, forward head, posterior pelvic tilt, and flexed trunk   LOWER EXTREMITY ROM:     Passive  Right eval Left eval  Hip flexion    Hip extension    Hip abduction    Hip adduction    Hip internal rotation    Hip external rotation    Knee flexion    Knee extension  -10 degrees  Ankle dorsiflexion    Ankle plantarflexion    Ankle inversion    Ankle eversion     (Blank rows = not tested)     LOWER EXTREMITY MMT:    MMT Right Eval Left Eval  Hip flexion 5 4  Hip extension    Hip abduction    Hip adduction    Hip internal rotation    Hip external rotation    Knee flexion 5 3  Knee extension 4 3  Ankle dorsiflexion 5 3  Ankle plantarflexion    Ankle inversion    Ankle eversion    (Blank rows = not tested)  BED MOBILITY:  Mod I per pt report  TRANSFERS: Assistive device utilized: None  Sit to stand: Modified independence Stand to sit: Modified independence Chair to chair: Modified independence Floor:  not assessed at eval  GAIT: Gait pattern: decreased hip/knee flexion- Left, decreased ankle dorsiflexion- Left, circumduction- Left, knee flexed in stance- Left, antalgic, trunk rotated posterior- Right, trunk rotated posterior- Left, trunk flexed, and wide BOS Distance walked: various clinic distances Assistive device utilized: None Level of assistance: Modified independence Comments: see above  FUNCTIONAL TESTS:     PATIENT SURVEYS:  Modified Oswestry 29/50  LEFS 30/80  TODAY'S TREATMENT:  03/08/2023  -Hamstring stretch LLE 3x45 seconds -Seated left piriformis stretch 3x30 seconds, cues to promote held stretch vs ballistic -Supine SKTC 2x1 minute LLE only, pt requires frequent redirection to task and cues for relaxed neck and shoulders -attempted hamstring curls w/  red theraband at ankles, but due to adduction of LLE PT unsure exercise was beneficial so d/c'd -using band at thighs STS 2x10, goal of keeping left knee abducted -Supine bridges w/ band at thighs (pt responds well to this task and cue) 2x12  Patient reporting fatigue so allowed to rest in supine x3 minutes before ambulating out of clinic.    PATIENT EDUCATION: Education details: Educated on need to continue exercise outside PT to improve LLE mechanics. Person educated: Patient and sister Education method: Medical illustrator Education comprehension: verbalized understanding, returned demonstration, and needs further education  HOME EXERCISE PROGRAM: To be initiated  GOALS: Goals reviewed with patient? Yes  SHORT TERM GOALS: Target date: 03/22/2023  Pt will be independent with initial HEP for improved strength, balance, transfers and gait. Baseline: Goal status: INITIAL   LONG TERM GOALS: Target date: 04/12/2023    Pt will be independent with final HEP for improved strength, balance, transfers and gait. Baseline:  Goal status: INITIAL  2.  Pt will improve his score to </= 25/50 on the Oswestry to demonstrate decreased disability level Baseline: 29/50 (7/5) Goal status: INITIAL  3.  Pt will improve his score on the LEFS to 35/80 to demonstrate decreased disability level Baseline: 30/80 (7/5) Goal status: INITIAL  4.  Pt will improve 5 x STS to less than or equal to 15 seconds to demonstrate improved functional strength and transfer efficiency.  Baseline: 20 sec (7/5) Goal status: INITIAL  5.  Pt will improve gait velocity to at least 3.0 ft/sec for improved gait efficiency and performance at mod I level  Baseline: 2.73 ft/sec mod I (7/5) Goal status: INITIAL    ASSESSMENT:  CLINICAL IMPRESSION: Patient requires frequent redirection to tasks and is very tangential during session.  He was mildly limited by fatigue at the end of the session requiring monitored  rest and ambulation out of clinic.  He does well with theraband for tactile cue to left LE for improved abduction particularly with bridging task.  He continues to benefit from skilled PT in order to improve LE mechanics of gait and safe independence with upright mobility.  OBJECTIVE IMPAIRMENTS: Abnormal gait, decreased balance, decreased cognition, decreased coordination, decreased knowledge of use of DME, difficulty walking, decreased ROM, decreased strength, increased muscle spasms, impaired flexibility, impaired sensation, impaired tone, impaired UE functional use, improper body mechanics, postural dysfunction, and pain.   ACTIVITY LIMITATIONS: carrying, lifting, bending, standing, squatting, stairs, transfers, bathing, and dressing  PARTICIPATION LIMITATIONS: cleaning, interpersonal relationship, and community activity  PERSONAL FACTORS: Time since onset of injury/illness/exacerbation and 1-2 comorbidities: seizure disorder, ADHD, and CP  are also affecting patient's functional outcome.   REHAB POTENTIAL: Fair chronic history of CP  CLINICAL DECISION MAKING: Stable/uncomplicated  EVALUATION COMPLEXITY: Low  PLAN:  PT FREQUENCY: 1x/week  PT DURATION:  7 total sessions with eval  PLANNED INTERVENTIONS: Therapeutic exercises, Therapeutic activity, Neuromuscular re-education, Balance training, Gait training, Patient/Family education, Self Care, Joint mobilization, Stair training, Orthotic/Fit training, DME instructions, Dry Needling, Electrical stimulation, Cryotherapy, Moist heat, Taping, Manual therapy, and Re-evaluation  PLAN FOR NEXT SESSION: modify HEP prn for LLE stretching, LLE strengthening, core stretching (side-bending or overhead reaching stretch?) and then strengthening/stability, postural stabilization, pt wants to try out  Lofstrand crutches, did we get OT referral-was request placed or can we use one from March?  Check all possible CPT codes: 29528 - PT Re-evaluation, 97110-  Therapeutic Exercise, [phone]- Neuro Re-education, [phone] - Gait Training, [phone] - Manual Therapy, [phone] - Therapeutic Activities, [phone] - Self Care, [phone] - Electrical stimulation (Manual), [phone] - Orthotic Fit, and [phone] - Aquatic therapy    Check all conditions that are expected to impact treatment: {Conditions expected to impact treatment:Contractures, spasticity or fracture relevant to requested treatment, Neurological condition and/or seizures, and Presence of Medical Equipment   If treatment provided at initial evaluation, no treatment charged due to lack of authorization.        Sadie Haber, PT, DPT  03/08/2023, 9:31 AM

## 2023-03-15 ENCOUNTER — Ambulatory Visit: Payer: MEDICAID | Admitting: Physical Therapy

## 2023-03-15 DIAGNOSIS — M5459 Other low back pain: Secondary | ICD-10-CM

## 2023-03-15 DIAGNOSIS — R2689 Other abnormalities of gait and mobility: Secondary | ICD-10-CM

## 2023-03-15 DIAGNOSIS — M6281 Muscle weakness (generalized): Secondary | ICD-10-CM

## 2023-03-15 DIAGNOSIS — M79605 Pain in left leg: Secondary | ICD-10-CM

## 2023-03-15 DIAGNOSIS — R2681 Unsteadiness on feet: Secondary | ICD-10-CM

## 2023-03-15 NOTE — Therapy (Signed)
OUTPATIENT PHYSICAL THERAPY NEURO TREATMENT   Patient Name: Roberto Manning MRN: [id number] DOB:1996-02-06, 27 y.o., male Today's Date: 03/15/2023   PCP: Benetta Spar, MD REFERRING PROVIDER: Merrily Brittle, DO  END OF SESSION:  PT End of Session - 03/15/23 3151     Visit Number 3    Number of Visits 7   with eval   Date for PT Re-Evaluation 05/03/23   to allow for scheduling delays   Authorization Type [employer]    PT Start Time 0930    PT Stop Time 1015    PT Time Calculation (min) 45 min    Activity Tolerance Patient tolerated treatment well    Behavior During Therapy [facility] for tasks assessed/performed;Impulsive              Past Medical History:  Diagnosis Date   ADHD    Cerebral palsy (HCC)    Seizures (HCC)    Past Surgical History:  Procedure Laterality Date   HIP PINNING     Patient Active Problem List   Diagnosis Date Noted   ADHD 04/20/2020   Seizures (HCC) 06/06/2017   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   Neutropenia (HCC) 04/28/2016   Cerebral palsy (HCC) 04/19/2016   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   Seizure disorder (HCC) 07/06/2012   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    ONSET DATE: 01/21/2023  REFERRING DIAG: 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 Pt reports that he is having ongoing low back pain and L leg pain. No falls or other acute changes. Pt does bring in his L hand splint, asking about OT referral.  Pt accompanied by: self and family member mom  PERTINENT HISTORY: Seizure disorder, ADHD, CP  PAIN:  Are you having pain? Yes: NPRS scale: 5/10 Pain location: L leg and back Pain description: sharp Aggravating factors: when its about to rain, when I get too cold Relieving factors: nothing (has  tried OTC meds and mm relaxers)  PRECAUTIONS: Fall and Other: seizures (last seizure was Jan 2024)  WEIGHT BEARING RESTRICTIONS: No  FALLS: Has patient fallen in last 6 months? Yes. Number of falls falls at least once per day to his L side (overheats and falls)  LIVING ENVIRONMENT: Lives with: lives with their family Lives in: House/apartment Stairs: Yes: Internal: 12 steps; on right going up Has following equipment at home: Walker - 2 wheeled  PLOF: Independent with gait and Independent with transfers  PATIENT GOALS: "trying to get myself better" "get my leg strength back so I can get back active"  OBJECTIVE:   DIAGNOSTIC FINDINGS:  Thoracic Spine Xray 01/21/23 FINDINGS: Lower thoracic scoliosis is apex to the left, centered at approximately T9. On the AP film, the pedicles are intact. No hemivertebra or segmentation anomaly is identified. The upper thoracic spine is obscured on the lateral and swimmer's views due to overlying skeletal and soft tissue structures. Visualized thoracic vertebral body heights and anteroposterior alignment are maintained. No definite fractures are visualized. Spinous processes in the lower thoracic spine are obscured by overlying ribs.  IMPRESSION:  1. Similar lower thoracic scoliosis.  2. No acute fractures are visualized, with limitations as above.  COGNITION: Overall cognitive status: History of cognitive impairments - at  baseline   SENSATION: Occasional N/T in LE Light touch WFL  MUSCLE TONE: clonus in BLE   POSTURE: rounded shoulders, forward head, posterior pelvic tilt, and flexed trunk   LOWER EXTREMITY ROM:     Passive  Right eval Left eval  Hip flexion    Hip extension    Hip abduction    Hip adduction    Hip internal rotation    Hip external rotation    Knee flexion    Knee extension  -10 degrees  Ankle dorsiflexion    Ankle plantarflexion    Ankle inversion    Ankle eversion     (Blank rows = not tested)     LOWER EXTREMITY MMT:    MMT Right Eval Left Eval  Hip flexion 5 4  Hip extension    Hip abduction    Hip adduction    Hip internal rotation    Hip external rotation    Knee flexion 5 3  Knee extension 4 3  Ankle dorsiflexion 5 3  Ankle plantarflexion    Ankle inversion    Ankle eversion    (Blank rows = not tested)  BED MOBILITY:  Mod I per pt report  TRANSFERS: Assistive device utilized: None  Sit to stand: Modified independence Stand to sit: Modified independence Chair to chair: Modified independence Floor:  not assessed at eval  GAIT: Gait pattern: decreased hip/knee flexion- Left, decreased ankle dorsiflexion- Left, circumduction- Left, knee flexed in stance- Left, antalgic, trunk rotated posterior- Right, trunk rotated posterior- Left, trunk flexed, and wide BOS Distance walked: various clinic distances Assistive device utilized: None Level of assistance: Modified independence Comments: see above  FUNCTIONAL TESTS:     PATIENT SURVEYS:  Modified Oswestry 29/50  LEFS 30/80  TODAY'S TREATMENT:  TherEx Reviewed HEP provided in previous session and adjusted HS stretch to long-sitting position  Child's pose stretch 3 x 30 sec each Added in L/R lateral SB with child's pose 2 x 30 sec each  Tried quadupred alt UE lifts  but pt unable to balance on LUE  Seated windmills x 8 reps  Sit to stand with purple ball punch-outs x 10 reps Sit to stand with purple ball L/R rotation x 10 reps each direction   PATIENT EDUCATION: Education details: reviewed current HEP and adjusted/revised HS stretch Person educated: Patient and mom Education method: Medical illustrator Education comprehension: verbalized understanding, returned demonstration, and needs further education  HOME EXERCISE PROGRAM: Access Code: [email or web address] URL: [email or web address] Date: 03/15/2023 Prepared by: Peter Congo  Exercises - Long Sitting Hamstring Stretch  - 1 x daily - 7 x weekly - 1 sets - 3-5 reps - Hooklying Single Knee to Chest Stretch with Towel  - 1 x daily - 7 x weekly - 1 sets - 3-5 reps - Sit to Stand with Resistance Around Legs  - 1 x daily - 7 x weekly - 3 sets - 10 reps - Supine Bridge with Resistance Band  - 1 x daily - 7 x weekly - 3 sets - 10 reps - Seated Piriformis Stretch  - 1 x daily - 7 x weekly - 1 sets - 3-5 reps - 30 sec hold   GOALS: Goals reviewed with patient? Yes  SHORT TERM GOALS: Target date: 03/22/2023  Pt will be independent with initial HEP for improved strength, balance, transfers and gait. Baseline: Goal status: INITIAL   LONG TERM GOALS: Target date: 04/12/2023    Pt will be independent with final HEP for improved strength, balance, transfers and gait. Baseline:  Goal status: INITIAL  2.  Pt will improve his score to </= 25/50 on the Oswestry to demonstrate decreased disability level Baseline: 29/50 (7/5) Goal status: INITIAL  3.  Pt will improve his score on the LEFS to 35/80 to demonstrate decreased disability level Baseline: 30/80 (7/5) Goal status: INITIAL  4.  Pt will improve 5 x STS to less than or equal to 15 seconds to demonstrate improved functional strength and transfer efficiency.  Baseline: 20 sec (7/5) Goal status: INITIAL  5.  Pt will improve  gait velocity to at least 3.0 ft/sec for improved gait efficiency and performance at mod I level  Baseline: 2.73 ft/sec mod I (7/5) Goal status: INITIAL    ASSESSMENT:  CLINICAL IMPRESSION: Emphasis of skilled PT session on reviewing HEP provided in previous session, adjusting HEP, and continuing to work on strengthening and stretching. Pt with ongoing distractibility and needs cues to attend to therapy session and perform exercises correctly. Pt reports relief of tightness following child's pose and standing stretches. Pt continues to benefit from skilled therapy services to work towards increased independence with pain management and improved strength and balance. Continue POC.   OBJECTIVE IMPAIRMENTS: Abnormal gait, decreased balance, decreased cognition, decreased coordination, decreased knowledge of use of DME, difficulty walking, decreased ROM, decreased strength, increased muscle spasms, impaired flexibility, impaired sensation, impaired tone, impaired UE functional use, improper body mechanics, postural dysfunction, and pain.   ACTIVITY LIMITATIONS: carrying, lifting, bending, standing, squatting, stairs, transfers, bathing, and dressing  PARTICIPATION LIMITATIONS: cleaning, interpersonal relationship, and community activity  PERSONAL FACTORS: Time since onset of injury/illness/exacerbation and 1-2 comorbidities: seizure disorder, ADHD, and CP  are also affecting patient's functional outcome.   REHAB POTENTIAL: Fair chronic history of CP  CLINICAL DECISION MAKING: Stable/uncomplicated  EVALUATION COMPLEXITY: Low  PLAN:  PT FREQUENCY: 1x/week  PT DURATION:  7 total sessions with eval  PLANNED INTERVENTIONS: Therapeutic exercises, Therapeutic activity, Neuromuscular re-education, Balance training, Gait training, Patient/Family education, Self Care, Joint mobilization, Stair training, Orthotic/Fit training, DME instructions, Dry Needling, Electrical stimulation, Cryotherapy,  Moist heat, Taping, Manual therapy, and Re-evaluation  PLAN FOR NEXT SESSION: modify HEP prn for LLE stretching, LLE strengthening, core stretching (side-bending or overhead reaching stretch?) and then strengthening/stability, postural stabilization, pt wants to try out Lofstrand crutches, did we get OT referral-was request placed or can we use one from March?  Check all possible CPT codes: 82956 - PT Re-evaluation, 97110- Therapeutic Exercise, [phone]- Neuro Re-education, [phone] - Gait Training, [phone] - Manual Therapy, [phone] - Therapeutic Activities, [phone] - Self Care, [phone] - Electrical stimulation (Manual), [phone] - Orthotic Fit, and [phone] - Aquatic therapy    Check all conditions that are expected to impact treatment: {Conditions expected to impact treatment:Contractures, spasticity or fracture relevant to requested treatment, Neurological condition and/or seizures, and Presence of Medical Equipment   If treatment provided at initial evaluation, no treatment charged due to lack of authorization.        Peter Congo, PT, DPT, CSRS   03/15/2023, 10:17 AM

## 2023-03-22 ENCOUNTER — Ambulatory Visit: Payer: MEDICAID | Attending: Physical Medicine and Rehabilitation | Admitting: Physical Therapy

## 2023-03-22 DIAGNOSIS — R41844 Frontal lobe and executive function deficit: Secondary | ICD-10-CM | POA: Diagnosis present

## 2023-03-22 DIAGNOSIS — R41840 Attention and concentration deficit: Secondary | ICD-10-CM | POA: Insufficient documentation

## 2023-03-22 DIAGNOSIS — M6281 Muscle weakness (generalized): Secondary | ICD-10-CM | POA: Insufficient documentation

## 2023-03-22 DIAGNOSIS — R278 Other lack of coordination: Secondary | ICD-10-CM | POA: Diagnosis present

## 2023-03-22 DIAGNOSIS — M5459 Other low back pain: Secondary | ICD-10-CM | POA: Diagnosis present

## 2023-03-22 DIAGNOSIS — M79605 Pain in left leg: Secondary | ICD-10-CM | POA: Insufficient documentation

## 2023-03-22 DIAGNOSIS — R2689 Other abnormalities of gait and mobility: Secondary | ICD-10-CM | POA: Diagnosis present

## 2023-03-22 DIAGNOSIS — R2681 Unsteadiness on feet: Secondary | ICD-10-CM | POA: Diagnosis present

## 2023-03-22 DIAGNOSIS — M24542 Contracture, left hand: Secondary | ICD-10-CM | POA: Diagnosis present

## 2023-03-22 NOTE — Therapy (Signed)
OUTPATIENT PHYSICAL THERAPY NEURO TREATMENT   Patient Name: Roberto Manning MRN: [id number] DOB:May 10, 1996, 27 y.o., male Today's Date: 03/22/2023   PCP: Benetta Spar, MD REFERRING PROVIDER: Merrily Brittle, DO  END OF SESSION:  PT End of Session - 03/22/23 0935     Visit Number 4    Number of Visits 7   with eval   Date for PT Re-Evaluation 05/03/23   to allow for scheduling delays   Authorization Type [facility]    PT Start Time [phone]    PT Stop Time 1013    PT Time Calculation (min) 38 min    Equipment Utilized During Treatment Gait belt    Activity Tolerance Patient tolerated treatment well    Behavior During Therapy [facility] for tasks assessed/performed;Impulsive               Past Medical History:  Diagnosis Date   ADHD    Cerebral palsy (HCC)    Seizures (HCC)    Past Surgical History:  Procedure Laterality Date   HIP PINNING     Patient Active Problem List   Diagnosis Date Noted   ADHD 04/20/2020   Seizures (HCC) 06/06/2017   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   Neutropenia (HCC) 04/28/2016   Cerebral palsy (HCC) 04/19/2016   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   Seizure disorder (HCC) 07/06/2012   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    ONSET DATE: 01/21/2023  REFERRING DIAG: 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 M54.50 (ICD-10-CM) - Low back pain, unspecified R26.81 (ICD-10-CM) - Unsteadiness on feet  THERAPY DIAG:  Muscle weakness (generalized)  Other abnormalities of gait and mobility  Unsteadiness on feet  Other low back pain  Pain in left leg  Rationale for Evaluation and Treatment: Rehabilitation  SUBJECTIVE:                                                                                                                                                                                              SUBJECTIVE STATEMENT: Pt reports his back is still bothering him (middle lower back), but his left leg is feeling better.  Pt accompanied by: self and family member mom  PERTINENT HISTORY: Seizure disorder, ADHD, CP  PAIN:  Are you having pain? Yes: NPRS scale: 5/10 Pain location: L leg and back Pain description: sharp Aggravating factors: when its about to rain, when I get too cold Relieving factors: nothing (has tried OTC meds and mm relaxers)  PRECAUTIONS: Fall and Other: seizures (last seizure was Jan 2024)  WEIGHT BEARING RESTRICTIONS: No  FALLS: Has patient fallen in last 6 months? Yes. Number of falls falls at least once per day to his L side (overheats and falls)  LIVING ENVIRONMENT: Lives with: lives with their family Lives in: House/apartment Stairs: Yes: Internal: 12 steps; on right going up Has following equipment at home: Walker - 2 wheeled  PLOF: Independent with gait and Independent with transfers  PATIENT GOALS: "trying to get myself better" "get my leg strength back so I can get back active"  OBJECTIVE:   DIAGNOSTIC FINDINGS:  Thoracic Spine Xray 01/21/23 FINDINGS: Lower thoracic scoliosis is apex to the left, centered at approximately T9. On the AP film, the pedicles are intact. No hemivertebra or segmentation anomaly is identified. The upper thoracic spine is obscured on the lateral and swimmer's views due to overlying skeletal and soft tissue structures. Visualized thoracic vertebral body heights and anteroposterior alignment are maintained. No definite fractures are visualized. Spinous processes in the lower thoracic spine are obscured by overlying ribs.  IMPRESSION:  1. Similar lower thoracic scoliosis.  2. No acute fractures are visualized, with limitations as above.  COGNITION: Overall cognitive status: History of cognitive impairments - at baseline   SENSATION: Occasional N/T in LE Light touch WFL  MUSCLE TONE: clonus in BLE   POSTURE:  rounded shoulders, forward head, posterior pelvic tilt, and flexed trunk   LOWER EXTREMITY ROM:     Passive  Right eval Left eval  Hip flexion    Hip extension    Hip abduction    Hip adduction    Hip internal rotation    Hip external rotation    Knee flexion    Knee extension  -10 degrees  Ankle dorsiflexion    Ankle plantarflexion    Ankle inversion    Ankle eversion     (Blank rows = not tested)     LOWER EXTREMITY MMT:    MMT Right Eval Left Eval  Hip flexion 5 4  Hip extension    Hip abduction    Hip adduction    Hip internal rotation    Hip external rotation    Knee flexion 5 3  Knee extension 4 3  Ankle dorsiflexion 5 3  Ankle plantarflexion    Ankle inversion    Ankle eversion    (Blank rows = not tested)  BED MOBILITY:  Mod I per pt report  TRANSFERS: Assistive device utilized: None  Sit to stand: Modified independence Stand to sit: Modified independence Chair to chair: Modified independence Floor:  not assessed at eval  GAIT: Gait pattern: decreased hip/knee flexion- Left, decreased ankle dorsiflexion- Left, circumduction- Left, knee flexed in stance- Left, antalgic, trunk rotated posterior- Right, trunk rotated posterior- Left, trunk flexed, and wide BOS Distance walked: various clinic distances Assistive device utilized: None Level of assistance: Modified independence Comments: see above  FUNCTIONAL TESTS:     PATIENT SURVEYS:  Modified Oswestry 29/50  LEFS 30/80  TODAY'S TREATMENT:  TherEx Supine LTR x 10 reps each direction Supine bridge with TA contract x 5 sec, x 10 reps Sidelying open books x 10 reps B  Added LTR to HEP, see bolded below  Gait Gait pattern: decreased hip/knee flexion- Left, decreased ankle dorsiflexion- Right, decreased ankle dorsiflexion- Left, knee flexed in stance- Right, lateral hip  instability, and lateral lean- Left Distance walked: 450 ft Assistive device utilized:  Lofstrand crutches Level of assistance: CGA Comments: trial gait with R Lofstrand crutch (with use of both crutches pt just holds crutch in L hand and carries it). Pt does exhibit improved gait mechanics with use of R Lofstrand crutch as compared to without any AD. Pt does exhibit ongoing leg length discrepancy with shorter LLE leading to shortened muscles on L side of trunk.   PATIENT EDUCATION: Education details: added to HEP, trial of Lofstrand crutch Person educated: Patient and mom Education method: Medical illustrator Education comprehension: verbalized understanding, returned demonstration, and needs further education  HOME EXERCISE PROGRAM: Access Code: [email or web address] URL: [email or web address] Date: 03/15/2023 Prepared by: Peter Congo  Exercises - Long Sitting Hamstring Stretch  - 1 x daily - 7 x weekly - 1 sets - 3-5 reps - Hooklying Single Knee to Chest Stretch with Towel  - 1 x daily - 7 x weekly - 1 sets - 3-5 reps - Sit to Stand with Resistance Around Legs  - 1 x daily - 7 x weekly - 3 sets - 10 reps - Supine Bridge with Resistance Band  - 1 x daily - 7 x weekly - 3 sets - 10 reps - Seated Piriformis Stretch  - 1 x daily - 7 x weekly - 1 sets - 3-5 reps - 30 sec hold - Supine Lower Trunk Rotation  - 1 x daily - 7 x weekly - 3 sets - 10 reps   GOALS: Goals reviewed with patient? Yes  SHORT TERM GOALS: Target date: 03/22/2023  Pt will be independent with initial HEP for improved strength, balance, transfers and gait. Baseline: Goal status: GOAL MET   LONG TERM GOALS: Target date: 04/12/2023    Pt will be independent with final HEP for improved strength, balance, transfers and gait. Baseline:  Goal status: INITIAL  2.  Pt will improve his score to </= 25/50 on the Oswestry to demonstrate decreased disability level Baseline: 29/50 (7/5) Goal status:  INITIAL  3.  Pt will improve his score on the LEFS to 35/80 to demonstrate decreased disability level Baseline: 30/80 (7/5) Goal status: INITIAL  4.  Pt will improve 5 x STS to less than or equal to 15 seconds to demonstrate improved functional strength and transfer efficiency.  Baseline: 20 sec (7/5) Goal status: INITIAL  5.  Pt will improve gait velocity to at least 3.0 ft/sec for improved gait efficiency and performance at mod I level  Baseline: 2.73 ft/sec mod I (7/5) Goal status: INITIAL    ASSESSMENT:  CLINICAL IMPRESSION: Emphasis of skilled PT session on reviewing several low back exercises/stretches to address ongoing low back pain as well as trialing gait with Lofstrand crutches. Pt likely with ongoing back pain due to postural impairments secondary to leg length discrepancy but can benefit from continued stretching and strengthening of core to decrease pain. Pt also exhibits improved gait mechanics with use of Lofstrand crutch this session, can benefit from continued practice with this device before proceeding with ordering device for patient. Continue POC.   OBJECTIVE IMPAIRMENTS: Abnormal gait, decreased balance, decreased cognition, decreased coordination,  decreased knowledge of use of DME, difficulty walking, decreased ROM, decreased strength, increased muscle spasms, impaired flexibility, impaired sensation, impaired tone, impaired UE functional use, improper body mechanics, postural dysfunction, and pain.   ACTIVITY LIMITATIONS: carrying, lifting, bending, standing, squatting, stairs, transfers, bathing, and dressing  PARTICIPATION LIMITATIONS: cleaning, interpersonal relationship, and community activity  PERSONAL FACTORS: Time since onset of injury/illness/exacerbation and 1-2 comorbidities: seizure disorder, ADHD, and CP  are also affecting patient's functional outcome.   REHAB POTENTIAL: Fair chronic history of CP  CLINICAL DECISION MAKING:  Stable/uncomplicated  EVALUATION COMPLEXITY: Low  PLAN:  PT FREQUENCY: 1x/week  PT DURATION:  7 total sessions with eval  PLANNED INTERVENTIONS: Therapeutic exercises, Therapeutic activity, Neuromuscular re-education, Balance training, Gait training, Patient/Family education, Self Care, Joint mobilization, Stair training, Orthotic/Fit training, DME instructions, Dry Needling, Electrical stimulation, Cryotherapy, Moist heat, Taping, Manual therapy, and Re-evaluation  PLAN FOR NEXT SESSION: modify HEP prn for LLE stretching, LLE strengthening, core stretching (side-bending or overhead reaching stretch?) and then strengthening/stability, postural stabilization, try R Lofstrand crutch again with gait  Check all possible CPT codes: 16109 - PT Re-evaluation, 97110- Therapeutic Exercise, [phone]- Neuro Re-education, [phone] - Gait Training, [phone] - Manual Therapy, 97530 - Therapeutic Activities, [phone] - Self Care, [phone] - Electrical stimulation (Manual), [phone] - Orthotic Fit, and [id number] - Aquatic therapy    Check all conditions that are expected to impact treatment: {Conditions expected to impact treatment:Contractures, spasticity or fracture relevant to requested treatment, Neurological condition and/or seizures, and Presence of Medical Equipment   If treatment provided at initial evaluation, no treatment charged due to lack of authorization.        Peter Congo, PT, DPT, CSRS   03/22/2023, 10:18 AM

## 2023-03-29 ENCOUNTER — Ambulatory Visit: Payer: MEDICAID | Admitting: Physical Therapy

## 2023-04-05 ENCOUNTER — Ambulatory Visit: Payer: MEDICAID | Admitting: Physical Therapy

## 2023-04-08 ENCOUNTER — Ambulatory Visit: Payer: MEDICAID | Admitting: Occupational Therapy

## 2023-04-08 ENCOUNTER — Encounter: Payer: Self-pay | Admitting: Occupational Therapy

## 2023-04-08 DIAGNOSIS — M24542 Contracture, left hand: Secondary | ICD-10-CM

## 2023-04-08 DIAGNOSIS — R41840 Attention and concentration deficit: Secondary | ICD-10-CM

## 2023-04-08 DIAGNOSIS — M6281 Muscle weakness (generalized): Secondary | ICD-10-CM

## 2023-04-08 DIAGNOSIS — R2681 Unsteadiness on feet: Secondary | ICD-10-CM

## 2023-04-08 DIAGNOSIS — R41844 Frontal lobe and executive function deficit: Secondary | ICD-10-CM

## 2023-04-08 NOTE — Therapy (Unsigned)
OUTPATIENT OCCUPATIONAL THERAPY NEURO EVALUATION  Patient Name: Roberto Manning MRN: [id number] DOB:[birth year], 27 y.o., male Today's Date: 04/08/2023  PCP: Benetta Spar, MD REFERRING PROVIDER: Merrily Brittle, DO  END OF SESSION:  OT End of Session - 04/08/23 1234     Visit Number 1    Number of Visits 6   + evaluation   Authorization Type [employer] - Auth Reqd    OT Start Time 1232    OT Stop Time 1322    OT Time Calculation (min) 50 min    Equipment Utilized During Treatment testing material    Activity Tolerance Patient tolerated treatment well    Behavior During Therapy [facility] for tasks assessed/performed             Past Medical History:  Diagnosis Date   ADHD    Cerebral palsy (HCC)    Seizures (HCC)    Past Surgical History:  Procedure Laterality Date   HIP PINNING     Patient Active Problem List   Diagnosis Date Noted   ADHD 04/20/2020   Seizures (HCC) 06/06/2017   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   Neutropenia (HCC) 04/28/2016   Cerebral palsy (HCC) 04/19/2016   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   Seizure disorder (HCC) 07/06/2012   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 Pt accompanied by: self and family member  PERTINENT HISTORY:  01/21/23: XR lumbar spine - Increased dextroscoliosis of the lumbar spine since previous, centered  at L2/L3.   PRECAUTIONS: Fall and Other: Seizures  WEIGHT BEARING RESTRICTIONS: No  PAIN:  Are you having pain? Yes: NPRS scale: 7-8/10 Pain location: low back - since his fall when he hit the table Pain description: aching in low back Aggravating factors: rain Relieving factors: massage, heating pad, tries medication   FALLS: Has patient fallen in last 6 months? Yes. Number of falls has been falling a lot - reports he fell this morning coming into the house.  He stayed at his mom's last night and forgot to take his medicine with him and hasn't been using his walker.  LIVING ENVIRONMENT: Lives with: lives with their family and [name] (sister) Lives in: apartment/townhouse Stairs: Yes: Internal: 1 flight - bedroom/shower upstairs steps; on left going up Has following equipment at home: Dan Humphreys - 2 wheeled and Tour manager  PLOF: Independent with basic ADLs  PATIENT GOALS: To get his splint fixed for his L hand.  OBJECTIVE:   HAND DOMINANCE: Right  ADLs: Overall ADLs: Limited assistance from family members but generally Mod I with BADLs Transfers/ambulation related to ADLs: Mod I with and without AE Eating: maybe help to cut steak but otherwise he does it himself Grooming: Brother cuts his hair and shaves him UB Dressing: Mod I LB Dressing: Mod I - including LLE brace and slip-on shoes Toileting: Mod I Bathing: Sister will help with bathing on occasion ie) to wash his back and under the L arm Tub Shower transfers: Sometimes supervised  but generally on his own. Equipment: Emergency planning/management officer and Grab bars  IADLs: Shopping: Goes with his mom Light housekeeping: Often gets help from family Meal Prep: Sister helps with cooking Community mobility: Mod Ind - occasionally uses his walker Medication management: Sister helps - just got a pill box Financial management: Family does this Handwriting:  NT  MOBILITY STATUS: Independent and Hx of falls  POSTURE COMMENTS:  rounded  shoulders, forward head, posterior pelvic tilt, flexed trunk , and Scoliotic Sitting balance: WFL  ACTIVITY TOLERANCE: Activity tolerance: Good in sitting.  FUNCTIONAL OUTCOME MEASURES: [place] IADL Scale: A summary score ranges from 0 (low function, dependent) to 8 (high function, independent) for women  and 0 through 5 for men to avoid potential gender bias.     UPPER EXTREMITY ROM:    Active ROM Right eval Left eval  Shoulder flexion WFL -25 %  Shoulder abduction  -25 %  Shoulder adduction    Shoulder extension  -25 %  Shoulder internal rotation    Shoulder external rotation    Elbow flexion  WFL  Elbow extension  -50 deg  Wrist flexion  hyperflexed  Wrist extension  35 deg from neutral  Wrist ulnar deviation    Wrist radial deviation    Wrist pronation    Wrist supination  -50 %  (Blank rows = not tested)  UPPER EXTREMITY MMT:     MMT Right eval Left eval  Shoulder flexion 4+/5 4-/5  Shoulder abduction    Shoulder adduction    Shoulder extension    Shoulder internal rotation    Shoulder external rotation    Middle trapezius    Lower trapezius    Elbow flexion  3+/5  Elbow extension  3-/5  Wrist flexion  4/5  Wrist extension  2-/5  Wrist ulnar deviation    Wrist radial deviation    Wrist pronation    Wrist supination  3-/5  (Blank rows = not tested)  HAND FUNCTION: Grip strength: Right: 46.0, 63.2, 53.1 lbs; Left: 15.2, 9.9, 20.5  lbs Averages Right: 54.1 lbs Left: 15.2 lbs (excessive wrist flexion and combined with elbow flexion  COORDINATION: NT at evaluation  SENSATION: Light touch: Impaired  - some numbness in L hand/fingers on occasion  EDEMA: NA  MUSCLE TONE: LUE: Hypertonic  COGNITION: Overall cognitive status: History of cognitive impairments - at baseline and memory and choices ie) needing to refer to family (mom/sister) for memory, forgot medication when staying with mom yesterday and then fell this morning  VISION: Subjective  report: No problems Baseline vision: No visual deficits Visual history: NA  VISION ASSESSMENT: Not tested  PERCEPTION: Not tested  PRAXIS: Not tested  OBSERVATIONS: Pleasant, talkative young gentleman with CP and L sided dysfunction, scoliotic stance with mobility without RW today.   TODAY'S TREATMENT:  NA - evaluation   PATIENT EDUCATION: Education details: OT POC and goal considerations Person educated: Patient and sister Education method: Explanation Education comprehension: verbalized understanding and needs further education  HOME EXERCISE PROGRAM: TBD   GOALS: Goals reviewed with patient? Yes  SHORT TERM GOALS: Target date: ***  ROM - stretches Baseline: Goal status: INITIAL  2.  Splint (fix/replace it) Baseline:  Goal status: INITIAL  3.  memory Baseline:  Goal status: INITIAL  4.  *** Baseline:  Goal status: INITIAL  5.  *** Baseline:  Goal status: INITIAL  6.  *** Baseline:  Goal status: INITIAL  LONG TERM GOALS: Target date: 05/20/23  *** Baseline:  Goal status: INITIAL  2.  *** Baseline:  Goal status: INITIAL  3.  *** Baseline:  Goal status: INITIAL  4.  *** Baseline:  Goal status: INITIAL  5.  *** Baseline:  Goal status: INITIAL  6.  *** Baseline:  Goal status: INITIAL  ASSESSMENT:  CLINICAL IMPRESSION: Patient is a 26 y.o. male who was seen today for occupational therapy evaluation for L UE dysfunction due to cerebral palsy and L hand contracture. Hx includes seizures. Patient currently presents with limited recent use of current L hand splint due to ill-repair.  He has decreased safety for independent function and is assisted by family but has potential for improved self management of some of his needs and therefore pt would benefit from skilled OT services in the outpatient setting to work on  impairments as noted below to help pt return to PLOF as able.     PERFORMANCE DEFICITS: in functional skills including ADLs, IADLs, tone, ROM, fascial restrictions, muscle spasms, flexibility, Fine motor control, Gross motor control, mobility, balance, decreased knowledge of precautions, decreased knowledge of use of DME, and UE functional use, cognitive skills including attention, learn, memory, problem solving, safety awareness, sequencing, temperament/personality, thought, and understand, and psychosocial skills including coping strategies, interpersonal interactions, and routines and behaviors.   IMPAIRMENTS: are limiting patient from ADLs and IADLs.   CO-MORBIDITIES: may have co-morbidities  that affects occupational performance. Patient will benefit from skilled OT to address above impairments and improve overall function.  MODIFICATION OR ASSISTANCE TO COMPLETE EVALUATION: No modification of tasks or assist necessary to complete an evaluation.  OT OCCUPATIONAL PROFILE AND HISTORY: Problem focused assessment: Including review of records relating to presenting problem.  CLINICAL DECISION MAKING: LOW - limited treatment options, no task modification necessary  REHAB POTENTIAL: Good  EVALUATION COMPLEXITY: Low    PLAN:  OT FREQUENCY: 1x/week  OT DURATION: 6 weeks  PLANNED INTERVENTIONS: self care/ADL training, therapeutic activity, neuromuscular re-education, manual therapy, passive range of motion, splinting, ultrasound, fluidotherapy, moist heat, patient/family education, cognitive remediation/compensation, coping strategies training, and DME and/or AE instructions  RECOMMENDED OTHER SERVICES: Currently has PT visit this week.  CONSULTED AND AGREED WITH PLAN OF CARE: Patient and family member/caregiver  PLAN FOR NEXT SESSION:  Follow up re: splint HEP for PROM of L UE Memory binder education Safety Education   Victorino Sparrow, OT 04/08/2023, 1:50 PM

## 2023-04-12 ENCOUNTER — Ambulatory Visit: Payer: MEDICAID | Admitting: Physical Therapy

## 2023-04-12 DIAGNOSIS — R2689 Other abnormalities of gait and mobility: Secondary | ICD-10-CM

## 2023-04-12 DIAGNOSIS — M5459 Other low back pain: Secondary | ICD-10-CM

## 2023-04-12 DIAGNOSIS — M79605 Pain in left leg: Secondary | ICD-10-CM

## 2023-04-12 DIAGNOSIS — M6281 Muscle weakness (generalized): Secondary | ICD-10-CM

## 2023-04-12 DIAGNOSIS — R2681 Unsteadiness on feet: Secondary | ICD-10-CM

## 2023-04-12 NOTE — Therapy (Signed)
OUTPATIENT PHYSICAL THERAPY NEURO TREATMENT   Patient Name: Roberto Manning MRN: [id number] DOB:30-Jul-1996, 27 y.o., male Today's Date: 04/12/2023   PCP: Benetta Spar, MD REFERRING PROVIDER: Merrily Brittle, DO  END OF SESSION:  PT End of Session - 04/12/23 0930     Visit Number 5    Number of Visits 7   with eval   Date for PT Re-Evaluation 05/03/23   to allow for scheduling delays   Authorization Type [employer]    PT Start Time 0930    PT Stop Time 1015    PT Time Calculation (min) 45 min    Equipment Utilized During Treatment Gait belt    Activity Tolerance Patient tolerated treatment well    Behavior During Therapy [facility] for tasks assessed/performed;Impulsive                Past Medical History:  Diagnosis Date   ADHD    Cerebral palsy (HCC)    Seizures (HCC)    Past Surgical History:  Procedure Laterality Date   HIP PINNING     Patient Active Problem List   Diagnosis Date Noted   ADHD 04/20/2020   Seizures (HCC) 06/06/2017   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   Neutropenia (HCC) 04/28/2016   Cerebral palsy (HCC) 04/19/2016   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   Seizure disorder (HCC) 07/06/2012   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    ONSET DATE: 01/21/2023  REFERRING DIAG: 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 M54.50 (ICD-10-CM) - Low back pain, unspecified R26.81 (ICD-10-CM) - Unsteadiness on feet  THERAPY DIAG:  Muscle weakness (generalized)  Unsteadiness on feet  Other abnormalities of gait and mobility  Other low back pain  Pain in left leg  Rationale for Evaluation and Treatment: Rehabilitation  SUBJECTIVE:                                                                                                                                                                                              SUBJECTIVE STATEMENT: Pt reports he feels like his pain is getting a little better, 5-6/10 in his low back and his L leg. Pt reports he has been doing his stretches at home and feels like they are helpful.  Pt and his mom also state that he fell on Monday after coming into the house, states his LLE gave out. Pt reports he is having more joint pain with weather changes in his L hip and knee.  Pt accompanied by: self and family member mom  PERTINENT  HISTORY: Seizure disorder, ADHD, CP  PAIN:  Are you having pain? Yes: NPRS scale: 5/10 Pain location: L leg and back Pain description: sharp Aggravating factors: when its about to rain, when I get too cold Relieving factors: nothing (has tried OTC meds and mm relaxers)  PRECAUTIONS: Fall and Other: seizures (last seizure was Jan 2024)  WEIGHT BEARING RESTRICTIONS: No  FALLS: Has patient fallen in last 6 months? Yes. Number of falls falls at least once per day to his L side (overheats and falls)  LIVING ENVIRONMENT: Lives with: lives with their family Lives in: House/apartment Stairs: Yes: Internal: 12 steps; on right going up Has following equipment at home: Walker - 2 wheeled  PLOF: Independent with gait and Independent with transfers  PATIENT GOALS: "trying to get myself better" "get my leg strength back so I can get back active"  OBJECTIVE:   DIAGNOSTIC FINDINGS:  Thoracic Spine Xray 01/21/23 FINDINGS: Lower thoracic scoliosis is apex to the left, centered at approximately T9. On the AP film, the pedicles are intact. No hemivertebra or segmentation anomaly is identified. The upper thoracic spine is obscured on the lateral and swimmer's views due to overlying skeletal and soft tissue structures. Visualized thoracic vertebral body heights and anteroposterior alignment are maintained. No definite fractures are visualized. Spinous processes in the lower thoracic spine are obscured by overlying ribs.  IMPRESSION:  1. Similar lower  thoracic scoliosis.  2. No acute fractures are visualized, with limitations as above.  COGNITION: Overall cognitive status: History of cognitive impairments - at baseline   SENSATION: Occasional N/T in LE Light touch WFL  MUSCLE TONE: clonus in BLE   POSTURE: rounded shoulders, forward head, posterior pelvic tilt, and flexed trunk   LOWER EXTREMITY ROM:     Passive  Right eval Left eval  Hip flexion    Hip extension    Hip abduction    Hip adduction    Hip internal rotation    Hip external rotation    Knee flexion    Knee extension  -10 degrees  Ankle dorsiflexion    Ankle plantarflexion    Ankle inversion    Ankle eversion     (Blank rows = not tested)     LOWER EXTREMITY MMT:    MMT Right Eval Left Eval  Hip flexion 5 4  Hip extension    Hip abduction    Hip adduction    Hip internal rotation    Hip external rotation    Knee flexion 5 3  Knee extension 4 3  Ankle dorsiflexion 5 3  Ankle plantarflexion    Ankle inversion    Ankle eversion    (Blank rows = not tested)  BED MOBILITY:  Mod I per pt report  TRANSFERS: Assistive device utilized: None  Sit to stand: Modified independence Stand to sit: Modified independence Chair to chair: Modified independence Floor:  not assessed at eval  GAIT: Gait pattern: decreased hip/knee flexion- Left, decreased ankle dorsiflexion- Left, circumduction- Left, knee flexed in stance- Left, antalgic, trunk rotated posterior- Right, trunk rotated posterior- Left, trunk flexed, and wide BOS Distance walked: various clinic distances Assistive device utilized: None Level of assistance: Modified independence Comments: see above  FUNCTIONAL TESTS:   [facility] PT Assessment - 04/12/23 0940       Ambulation/Gait   Gait velocity 32.8 ft over 9.19 sec = 3.57 ft/sec   no AD     Standardized Balance Assessment   Standardized Balance Assessment Five Times Sit to Stand  Five times sit to stand comments  19.18 sec   no UE  support             PATIENT SURVEYS:  Modified Oswestry 29/50  LEFS 30/80  TODAY'S TREATMENT:                                                                                                                              TherEx SciFit multi-peaks level 4 for 8 minutes using BLEs for neural priming for reciprocal movement, dynamic cardiovascular warmup and increased amplitude of stepping. RPE of 9/10 following activity but pt is able to carry on a conversation during exercise.   Gait Gait pattern: decreased hip/knee flexion- Left, decreased ankle dorsiflexion- Right, decreased ankle dorsiflexion- Left, knee flexed in stance- Right, lateral hip instability, and lateral lean- Left Distance walked: 300 ft Assistive device utilized:  Lofstrand crutches Level of assistance: CGA Comments: with R Lofstrand crutch   Provided information on local medical supply stores on where to purchase a Lofstrand crutch out of pocket if patient interested. Patient to let this therapist know if he is unable to obtain the device and this therapist can assist patient with obtaining a doctor's order for device.   TherAct For LTG assessment:  [facility] PT Assessment - 04/12/23 0940       Ambulation/Gait   Gait velocity 32.8 ft over 9.19 sec = 3.57 ft/sec   no AD     Standardized Balance Assessment   Standardized Balance Assessment Five Times Sit to Stand    Five times sit to stand comments  19.18 sec   no UE support           In // bars to work on hip strengthening, SLS stability, and dynamic standing balance: Lateral stepping L/R 3 x 10 ft each direction no UE support Added in blue foam beam 3 x 10 ft L/R intermittent UE support Added in alt L/R gumdrop taps from blue foam beam 3 x 10 ft L/R with BUE support Initially with difficulty sequencing task, improves with verbal and manual cues   PATIENT EDUCATION: Education details: continue HEP, where to purchase a Lofstrand crutch, results of  OM Person educated: Patient and mom Education method: Explanation, Demonstration, and Handouts Education comprehension: verbalized understanding, returned demonstration, and needs further education  HOME EXERCISE PROGRAM: Access Code: [email or web address] URL: [email or web address] Date: 03/15/2023 Prepared by: Peter Congo  Exercises - Long Sitting Hamstring Stretch  - 1 x daily - 7 x weekly - 1 sets - 3-5 reps - Hooklying Single Knee to Chest Stretch with Towel  - 1 x daily - 7 x weekly - 1 sets - 3-5 reps - Sit to Stand with Resistance Around Legs  - 1 x daily - 7 x weekly - 3 sets - 10 reps - Supine Bridge with Resistance Band  - 1 x daily - 7 x weekly - 3 sets - 10 reps - Seated Piriformis Stretch  - 1  x daily - 7 x weekly - 1 sets - 3-5 reps - 30 sec hold - Supine Lower Trunk Rotation  - 1 x daily - 7 x weekly - 3 sets - 10 reps   GOALS: Goals reviewed with patient? Yes  SHORT TERM GOALS: Target date: 03/22/2023  Pt will be independent with initial HEP for improved strength, balance, transfers and gait. Baseline: Goal status: GOAL MET   LONG TERM GOALS: Target date: 05/03/2023 (updated to match last scheduled appt within POC)   Pt will be independent with final HEP for improved strength, balance, transfers and gait. Baseline:  Goal status: INITIAL  2.  Pt will improve his score to </= 25/50 on the Oswestry to demonstrate decreased disability level Baseline: 29/50 (7/5) Goal status: INITIAL  3.  Pt will improve his score on the LEFS to 35/80 to demonstrate decreased disability level Baseline: 30/80 (7/5) Goal status: INITIAL  4.  Pt will improve 5 x STS to less than or equal to 15 seconds to demonstrate improved functional strength and transfer efficiency.  Baseline: 20 sec (7/5), 19.18 sec (8/23) Goal status: IN PROGRESS  5.  Pt will improve gait velocity to at least 3.0 ft/sec for improved gait efficiency and performance at mod I level  Baseline: 2.73 ft/sec  mod I (7/5), 3.57 ft/sec mod I (8/23) Goal status: MET    ASSESSMENT:  CLINICAL IMPRESSION: Emphasis of skilled PT session on assessing several LTG and extending goal date to match last scheduled appointment within POC. Pt has met 1/2 LTG assessed this date due to improving his gait speed from 2.73 ft/sec to 3.57 ft/sec, exceeding goal of 3.0 ft/sec. Pt did improve his 5xSTS score but did not quite meet his LTG yet. Also trialed gait again with R Lofstrand crutch, pt exhibits improved balance with use of device. Provided information on where patient can purchase this device in the community. Pt continues to benefit from skilled therapy services to work towards increasing his safety and independence with functional mobility and increasing his independence with pain management. Continue POC.   OBJECTIVE IMPAIRMENTS: Abnormal gait, decreased balance, decreased cognition, decreased coordination, decreased knowledge of use of DME, difficulty walking, decreased ROM, decreased strength, increased muscle spasms, impaired flexibility, impaired sensation, impaired tone, impaired UE functional use, improper body mechanics, postural dysfunction, and pain.   ACTIVITY LIMITATIONS: carrying, lifting, bending, standing, squatting, stairs, transfers, bathing, and dressing  PARTICIPATION LIMITATIONS: cleaning, interpersonal relationship, and community activity  PERSONAL FACTORS: Time since onset of injury/illness/exacerbation and 1-2 comorbidities: seizure disorder, ADHD, and CP  are also affecting patient's functional outcome.   REHAB POTENTIAL: Fair chronic history of CP  CLINICAL DECISION MAKING: Stable/uncomplicated  EVALUATION COMPLEXITY: Low  PLAN:  PT FREQUENCY: 1x/week  PT DURATION:  7 total sessions with eval  PLANNED INTERVENTIONS: Therapeutic exercises, Therapeutic activity, Neuromuscular re-education, Balance training, Gait training, Patient/Family education, Self Care, Joint mobilization,  Stair training, Orthotic/Fit training, DME instructions, Dry Needling, Electrical stimulation, Cryotherapy, Moist heat, Taping, Manual therapy, and Re-evaluation  PLAN FOR NEXT SESSION: modify HEP prn for LLE stretching, LLE strengthening, core stretching (side-bending or overhead reaching stretch?) and then strengthening/stability, postural stabilization, did pt get Lofstrand crutch?  Check all possible CPT codes: 32440 - PT Re-evaluation, 97110- Therapeutic Exercise, [phone]- Neuro Re-education, [phone] - Gait Training, [phone] - Manual Therapy, 97530 - Therapeutic Activities, [phone] - Self Care, [phone] - Electrical stimulation (Manual), [phone] - Orthotic Fit, and [id number] - Aquatic therapy    Check all conditions that are  expected to impact treatment: {Conditions expected to impact treatment:Contractures, spasticity or fracture relevant to requested treatment, Neurological condition and/or seizures, and Presence of Medical Equipment   If treatment provided at initial evaluation, no treatment charged due to lack of authorization.        Peter Congo, PT, DPT, CSRS   04/12/2023, 10:16 AM

## 2023-04-15 ENCOUNTER — Ambulatory Visit: Payer: MEDICAID | Admitting: Occupational Therapy

## 2023-04-15 DIAGNOSIS — M6281 Muscle weakness (generalized): Secondary | ICD-10-CM

## 2023-04-15 DIAGNOSIS — R278 Other lack of coordination: Secondary | ICD-10-CM

## 2023-04-15 DIAGNOSIS — M24542 Contracture, left hand: Secondary | ICD-10-CM

## 2023-04-15 NOTE — Therapy (Signed)
OUTPATIENT OCCUPATIONAL THERAPY NEURO TREATMENT  Patient Name: Roberto Manning MRN: [id number] DOB:Oct 29, 1995, 27 y.o., male Today's Date: 04/15/2023  PCP: Benetta Spar, MD REFERRING PROVIDER: Merrily Brittle, DO  END OF SESSION:  OT End of Session - 04/15/23 0801     Visit Number 2    Number of Visits 6   + evaluation   Date for OT Re-Evaluation 05/24/23    Authorization Type [employer] - Auth Reqd    OT Start Time 0802    OT Stop Time 0847    OT Time Calculation (min) 45 min    Activity Tolerance Patient tolerated treatment well    Behavior During Therapy 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  Pt accompanied by: self and family member  PERTINENT HISTORY:  01/21/23: XR lumbar spine - Increased dextroscoliosis of the lumbar spine since previous, centered at L2/L3.   PRECAUTIONS: Fall and Other: Seizures  WEIGHT BEARING RESTRICTIONS: No  PAIN:  Are you  having pain? Yes: NPRS scale: 7-8/10 Pain location: low back - since his fall when he hit the table Pain description: aching in low back Aggravating factors: rain Relieving factors: massage, heating pad, tries medication   FALLS: Has patient fallen in last 6 months? Yes. Number of falls has been falling a lot - reports he fell this morning coming into the house.  He stayed at his mom's last night and forgot to take his medicine with him and hasn't been using his walker.  LIVING ENVIRONMENT: Lives with: lives with their family and [name] (sister) Lives in: apartment/townhouse Stairs: Yes: Internal: 1 flight - bedroom/shower upstairs steps; on left going up Has following equipment at home: Dan Humphreys - 2 wheeled and Tour manager  PLOF: Independent with basic ADLs  PATIENT GOALS: To get his splint fixed for his L hand.  OBJECTIVE:   HAND DOMINANCE: Right  ADLs: Overall ADLs: Limited assistance from family members but generally Mod I with BADLs Transfers/ambulation related to ADLs: Mod I with and without AE Eating: maybe help to cut steak but otherwise he does it himself Grooming: Brother cuts his hair and shaves him UB Dressing: Mod I LB Dressing: Mod I - including LLE brace and slip-on shoes Toileting: Mod I Bathing: Sister will help with bathing on occasion ie) to wash his back and under the L arm Tub Shower transfers: Sometimes supervised but generally on his own. Equipment: Emergency planning/management officer and Grab bars  IADLs: Shopping:  Goes with his mom Light housekeeping: Often gets help from family Meal Prep: Sister helps with cooking Community mobility: Mod Ind - occasionally uses his walker Medication management: Sister helps - just got a pill box Financial management: Family does this Handwriting:  NT  MOBILITY STATUS: Independent and Hx of falls  POSTURE COMMENTS:  rounded shoulders, forward head, posterior pelvic tilt, flexed trunk , and Scoliotic Sitting balance:  WFL  ACTIVITY TOLERANCE: Activity tolerance: Good in sitting.  FUNCTIONAL OUTCOME MEASURES: [place] IADL Scale: A summary score ranges from 0 (low function, dependent) to 8 (high function, independent) for women  and 0 through 5 for men to avoid potential gender bias.     UPPER EXTREMITY ROM:    Active ROM Right eval Left eval  Shoulder flexion WFL -25 %  Shoulder abduction  -25 %  Shoulder adduction    Shoulder extension  -25 %  Shoulder internal rotation    Shoulder external rotation    Elbow flexion  WFL  Elbow extension  -50 deg  Wrist flexion  hyperflexed  Wrist extension  35 deg from neutral  Wrist ulnar deviation    Wrist radial deviation    Wrist pronation    Wrist supination  -50 %  (Blank rows = not tested)  UPPER EXTREMITY MMT:     MMT Right eval Left eval  Shoulder flexion 4+/5 4-/5  Shoulder abduction    Shoulder adduction    Shoulder extension    Shoulder internal rotation    Shoulder external rotation    Middle trapezius    Lower trapezius    Elbow flexion  3+/5  Elbow extension  3-/5  Wrist flexion  4/5  Wrist extension  2-/5  Wrist ulnar deviation    Wrist radial deviation    Wrist pronation    Wrist supination  3-/5  (Blank rows = not tested)  HAND FUNCTION: Grip strength: Right: 46.0, 63.2, 53.1 lbs; Left: 15.2, 9.9, 20.5  lbs Averages Right: 54.1 lbs Left: 15.2 lbs (excessive wrist flexion and combined with elbow flexion)  COORDINATION: NT at evaluation  SENSATION: Light touch: Impaired  - some numbness in L hand/fingers on occasion  EDEMA: NA  MUSCLE TONE: LUE: Hypertonic  COGNITION: Overall cognitive status: History of cognitive impairments - at baseline and memory and choices ie) needing to refer to family (mom/sister) for memory, forgot medication when staying with mom yesterday and then fell this morning  VISION: Subjective report: No problems Baseline vision: No visual deficits Visual history: NA  VISION  ASSESSMENT: Not tested  PERCEPTION: Not tested  PRAXIS: Not tested  OBSERVATIONS: Pleasant, talkative young gentleman with CP and L sided dysfunction, scoliotic stance with mobility without RW today.   TODAY'S TREATMENT:                                                                                                                                Splinting:  Patient brought his  hand splint again today and OTR is awaiting the new pin to fix the splint as vendor was contacted and is sending a replacement pin.  Trialed different wrist splints for improved wrist position for functional grasp and release activities.  Simple wrist cock up splint trialed with improved position of wrist noted but sample was too tight and online suggestion made.  New resting hand splint also shown with plans to contact vendor to see about reimbursement.    Therapeutic Exercises:  Reviewed Stretching Exercises as listed below  - Wrist Prayer Stretch  - 2-3 x daily - 1 sets - 5-10 reps  - Forearm Supination PROM  - 1-2 x daily - 1 sets - 5-10 reps  - Supported Elbow Flexion Extension PROM  - 1 x daily - 1-2 sets - 5-10 reps   Patient encouraged to work on wrist stretch versus stretching fingers and shown how to use a pillow to help with elbow stretches.  Therapeutic Activities:  Patient engaged in grasp and release with and without splints trialled with wrist in nearly 90 degrees of flexion without splint and only slight flexion with splint allowing him increased ease with picking up and stacking various blocks without knocking over towers.   PATIENT EDUCATION: Education details: LUE stretches Person educated: Patient Education method: Explanation, Demonstration, Tactile cues, Verbal cues, and Handouts Education comprehension: verbalized understanding, returned demonstration, verbal cues required, tactile cues required, and needs further education  HOME EXERCISE PROGRAM: 04/15/23: Wrist PROM stretches Access  Code: [email or web address]   GOALS: Goals reviewed with patient? Yes  SHORT TERM GOALS: Target date: 05/03/23  Patient will demonstrate ROM HEP with 25% verbal cues or less for proper execution. Baseline: No formal HEP and LUE hypertonicity Goal status: IN Progress  2.  Patient will demonstrate LUE splint application with min assist. Baseline: Not wearing splint - missing pin to lock wrist in place Goal status: IN Progress  3. Pt will verbalize and demonstrate understanding of ways to keep thinking skills sharp and ways to compensate for STM changes including use of Memory Binder. Baseline: not yet initiated Goal status: IN Progress  4.  Pt will be able to reports 3+ ideas for safety recommendations to decrease fall risk Baseline: Multiple falls Goal status: INITIAL   LONG TERM GOALS: Target date: 05/24/23  Patient will demonstrate MI with HEP for contracture management.  Baseline: No formal HEP and LUE hypertonicity Goal status: IN Progress  2.  Patient will demonstrate ModI with LUE splint application Baseline: Not wearing splint - missing pin to lock wrist in place Goal status: IN Progress  3.  Patient will be able to sort his own medication into pill box weekly with supervision Baseline: Completed by sister. Goal status: INITIAL  4.  Patient will be able to prepare simple foods safely in the [facility] will list of 5-10 items he can prepare in his Memory binder. Baseline: Family prepares meals. Goal status: INITIAL   ASSESSMENT:  CLINICAL IMPRESSION: Patient is a 27 y.o. male who was seen today for occupational therapy treatment for L UE contracture due to cerebral palsy.  OTR is awaiting replacement pin for current L hand splint with trial of wrist cock up splint for functional task being helpful.  He will benefit from skilled OT services in the outpatient setting to work on impairments as noted below to help pt return to PLOF as able.     PERFORMANCE DEFICITS: in  functional skills including ADLs, IADLs, tone, ROM, fascial restrictions, muscle spasms,  flexibility, Fine motor control, Gross motor control, mobility, balance, decreased knowledge of precautions, decreased knowledge of use of DME, and UE functional use, cognitive skills including attention, learn, memory, problem solving, safety awareness, sequencing, temperament/personality, thought, and understand, and psychosocial skills including coping strategies, interpersonal interactions, and routines and behaviors.   IMPAIRMENTS: are limiting patient from ADLs and IADLs.   CO-MORBIDITIES: may have co-morbidities  that affects occupational performance. Patient will benefit from skilled OT to address above impairments and improve overall function.  REHAB POTENTIAL: Good   PLAN:  OT FREQUENCY: 1x/week  OT DURATION: 6 weeks  PLANNED INTERVENTIONS: self care/ADL training, therapeutic activity, neuromuscular re-education, manual therapy, passive range of motion, splinting, ultrasound, fluidotherapy, moist heat, patient/family education, cognitive remediation/compensation, coping strategies training, and DME and/or AE instructions  RECOMMENDED OTHER SERVICES: Currently has PT visit this week.  CONSULTED AND AGREED WITH PLAN OF CARE: Patient and family member/caregiver  PLAN FOR NEXT SESSION:  Follow up re: splint Progress HEP for PROM of L UE Memory binder exploration/education Safety Education  Victorino Sparrow, OT 04/15/2023, 9:10 AM

## 2023-04-15 NOTE — Patient Instructions (Signed)
LUE PROM Stretches  Access Code: [email or web address] URL: [email or web address] Date: 04/15/2023 Prepared by: Amada Kingfisher  Exercises - Wrist Prayer Stretch  - 2-3 x daily - 1 sets - 5-10 reps - Forearm Supination PROM  - 1-2 x daily - 1 sets - 5-10 reps - Supported Elbow Flexion Extension PROM  - 1 x daily - 1-2 sets - 5-10 reps

## 2023-04-18 ENCOUNTER — Other Ambulatory Visit: Payer: Self-pay | Admitting: Podiatry

## 2023-04-23 ENCOUNTER — Ambulatory Visit: Payer: MEDICAID | Attending: Physical Medicine and Rehabilitation | Admitting: Occupational Therapy

## 2023-04-23 DIAGNOSIS — M24542 Contracture, left hand: Secondary | ICD-10-CM | POA: Diagnosis present

## 2023-04-23 DIAGNOSIS — R29898 Other symptoms and signs involving the musculoskeletal system: Secondary | ICD-10-CM | POA: Insufficient documentation

## 2023-04-23 DIAGNOSIS — R2689 Other abnormalities of gait and mobility: Secondary | ICD-10-CM | POA: Diagnosis present

## 2023-04-23 DIAGNOSIS — M79605 Pain in left leg: Secondary | ICD-10-CM | POA: Diagnosis present

## 2023-04-23 DIAGNOSIS — M6281 Muscle weakness (generalized): Secondary | ICD-10-CM | POA: Diagnosis present

## 2023-04-23 DIAGNOSIS — R41844 Frontal lobe and executive function deficit: Secondary | ICD-10-CM | POA: Insufficient documentation

## 2023-04-23 DIAGNOSIS — M5459 Other low back pain: Secondary | ICD-10-CM | POA: Diagnosis present

## 2023-04-23 DIAGNOSIS — R2681 Unsteadiness on feet: Secondary | ICD-10-CM | POA: Insufficient documentation

## 2023-04-23 DIAGNOSIS — R278 Other lack of coordination: Secondary | ICD-10-CM | POA: Insufficient documentation

## 2023-04-23 NOTE — Therapy (Signed)
OUTPATIENT OCCUPATIONAL THERAPY NEURO TREATMENT  Patient Name: Roberto Manning MRN: [id number] DOB:[birth year], 27 y.o., male Today's Date: 04/23/2023  PCP: Roberto Spar, MD REFERRING PROVIDER: Merrily Brittle, DO  END OF SESSION:  OT End of Session - 04/23/23 0930     Visit Number 3    Number of Visits 6   + evaluation   Date for OT Re-Evaluation 05/24/23    Authorization Type [employer] - Auth Reqd    Activity Tolerance Patient tolerated treatment well    Behavior During Therapy [facility] for tasks assessed/performed             Past Medical History:  Diagnosis Date   ADHD    Cerebral palsy (HCC)    Seizures (HCC)    Past Surgical History:  Procedure Laterality Date   HIP PINNING     Patient Active Problem List   Diagnosis Date Noted   ADHD 04/20/2020   Seizures (HCC) 06/06/2017   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   Neutropenia (HCC) 04/28/2016   Cerebral palsy (HCC) 04/19/2016   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 He has not been able to adjust his brace in years. He is unsure where his [name] is. He brought in his memory binder today.   Pt accompanied by: self  PERTINENT HISTORY:  01/21/23: XR lumbar spine - Increased dextroscoliosis of the lumbar spine since previous, centered at L2/L3.   PRECAUTIONS: Fall and Other: Seizures  WEIGHT BEARING  RESTRICTIONS: No  PAIN:  Are you having pain? Yes: NPRS scale: 7-8/10 Pain location: low back - since his fall when he hit the table Pain description: aching in low back Aggravating factors: rain Relieving factors: massage, heating pad, tries medication   FALLS: Has patient fallen in last 6 months? Yes. Number of falls has been falling a lot - reports he fell this morning coming into the house.  He stayed at his mom's last night and forgot to take his medicine with him and hasn't been using his walker.  LIVING ENVIRONMENT: Lives with: lives with their family and Roberto Manning (sister) Lives in: apartment/townhouse Stairs: Yes: Internal: 1 flight - bedroom/shower upstairs steps; on left going up Has following equipment at home: Dan Humphreys - 2 wheeled and Tour manager  PLOF: Independent with basic ADLs  PATIENT GOALS: To get his splint fixed for his L hand.  OBJECTIVE:   HAND DOMINANCE: Right  ADLs: Overall ADLs: Limited assistance from family members but generally Mod I with BADLs Transfers/ambulation related to ADLs: Mod I with and without AE Eating: maybe help to cut steak but otherwise he does it himself Grooming: Brother cuts his hair and shaves him UB Dressing: Mod I LB Dressing: Mod I - including LLE brace and slip-on shoes Toileting: Mod I Bathing: Sister will help with bathing on occasion ie) to wash his back and under the L arm Tub Shower transfers: Sometimes supervised but generally on his own. Equipment: Transfer tub bench and Grab bars  IADLs:  Shopping: Goes with his mom Light housekeeping: Often gets help from family Meal Prep: Sister helps with cooking Community mobility: Mod Ind - occasionally uses his walker Medication management: Sister helps - just got a pill box Financial management: Family does this Handwriting:  NT  MOBILITY STATUS: Independent and Hx of falls  POSTURE COMMENTS:  rounded shoulders, forward head, posterior pelvic tilt, flexed trunk , and  Scoliotic Sitting balance: WFL  ACTIVITY TOLERANCE: Activity tolerance: Good in sitting.  FUNCTIONAL OUTCOME MEASURES: [place] IADL Scale: A summary score ranges from 0 (low function, dependent) to 8 (high function, independent) for women  and 0 through 5 for men to avoid potential gender bias.     UPPER EXTREMITY ROM:    Active ROM Right eval Left eval  Shoulder flexion WFL -25 %  Shoulder abduction  -25 %  Shoulder adduction    Shoulder extension  -25 %  Shoulder internal rotation    Shoulder external rotation    Elbow flexion  WFL  Elbow extension  -50 deg  Wrist flexion  hyperflexed  Wrist extension  35 deg from neutral  Wrist ulnar deviation    Wrist radial deviation    Wrist pronation    Wrist supination  -50 %  (Blank rows = not tested)  UPPER EXTREMITY MMT:     MMT Right eval Left eval  Shoulder flexion 4+/5 4-/5  Shoulder abduction    Shoulder adduction    Shoulder extension    Shoulder internal rotation    Shoulder external rotation    Middle trapezius    Lower trapezius    Elbow flexion  3+/5  Elbow extension  3-/5  Wrist flexion  4/5  Wrist extension  2-/5  Wrist ulnar deviation    Wrist radial deviation    Wrist pronation    Wrist supination  3-/5  (Blank rows = not tested)  HAND FUNCTION: Grip strength: Right: 46.0, 63.2, 53.1 lbs; Left: 15.2, 9.9, 20.5  lbs Averages Right: 54.1 lbs Left: 15.2 lbs (excessive wrist flexion and combined with elbow flexion)  COORDINATION: NT at evaluation  SENSATION: Light touch: Impaired  - some numbness in L hand/fingers on occasion  EDEMA: NA  MUSCLE TONE: LUE: Hypertonic  COGNITION: Overall cognitive status: History of cognitive impairments - at baseline and memory and choices ie) needing to refer to family (mom/sister) for memory, forgot medication when staying with mom yesterday and then fell this morning  VISION: Subjective report: No problems Baseline vision: No visual deficits Visual  history: NA  VISION ASSESSMENT: Not tested  PERCEPTION: Not tested  PRAXIS: Not tested  OBSERVATIONS: Pleasant, talkative young gentleman with CP and L sided dysfunction, scoliotic stance with mobility without RW today.   TODAY'S TREATMENT:                                                                                                                              - Self-care/home management completed for duration  as noted below including:  Patient brought his hand splint again today and OTR placed Pro Lock in place and used [employer] to increase tension to 4. OT took pictures of fit to promote carryover with donning. OT provided written instructions on how to adjust tension.   Pt practiced donning and doffing with therapist providing written step by step instructions.     OT provided written instructions on how to remove, clean, and replace padding.  PATIENT EDUCATION: Education details: Splint wear and care Person educated: Patient Education method: Explanation, Demonstration, Tactile cues, Verbal cues, and Handouts Education comprehension: verbalized understanding, returned demonstration, verbal cues required, tactile cues required, and needs further education  HOME EXERCISE PROGRAM: 04/15/23: Wrist PROM stretches Access Code: [email or web address]   GOALS: Goals reviewed with patient? Yes  SHORT TERM GOALS: Target date: 05/03/23  Patient will demonstrate ROM HEP with 25% verbal cues or less for proper execution. Baseline: No formal HEP and LUE hypertonicity Goal status: IN Progress  2.  Patient will demonstrate LUE splint application with min assist. Baseline: Not wearing splint - missing pin to lock wrist in place Goal status: IN Progress  3. Pt will verbalize and demonstrate understanding of ways to keep thinking skills sharp and ways to compensate for STM changes including use of Memory Binder. Baseline: not yet initiated Goal status: IN Progress  4.  Pt will be able to  reports 3+ ideas for safety recommendations to decrease fall risk Baseline: Multiple falls Goal status: INITIAL   LONG TERM GOALS: Target date: 05/24/23  Patient will demonstrate MI with HEP for contracture management.  Baseline: No formal HEP and LUE hypertonicity Goal status: IN Progress  2.  Patient will demonstrate ModI with LUE splint application Baseline: Not wearing splint - missing pin to lock wrist in place Goal status: IN Progress  3.  Patient will be able to sort his own medication into pill box weekly with supervision Baseline: Completed by sister. Goal status: INITIAL  4.  Patient will be able to prepare simple foods safely in the [facility] will list of 5-10 items he can prepare in his Memory binder. Baseline: Family prepares meals. Goal status: INITIAL   ASSESSMENT:  CLINICAL IMPRESSION: Patient will require repeat education for independent carry-over of splint donning, doffing, and adjustment.  He remains motivated for therapy as needed to progress towards goals.   PERFORMANCE DEFICITS: in functional skills including ADLs, IADLs, tone, ROM, fascial restrictions, muscle spasms, flexibility, Fine motor control, Gross motor control, mobility, balance, decreased knowledge of precautions, decreased knowledge of use of DME, and UE functional use, cognitive skills including attention, learn, memory, problem solving, safety awareness, sequencing, temperament/personality, thought, and understand, and psychosocial skills including coping strategies, interpersonal interactions, and routines and behaviors.   IMPAIRMENTS: are limiting patient from ADLs and IADLs.   CO-MORBIDITIES: may have co-morbidities  that affects occupational performance. Patient will benefit from skilled OT to address above impairments and improve overall function.  REHAB POTENTIAL: Good   PLAN:  OT FREQUENCY: 1x/week  OT DURATION: 6 weeks  PLANNED INTERVENTIONS: self care/ADL training,  therapeutic activity, neuromuscular re-education, manual therapy, passive range of motion, splinting, ultrasound, fluidotherapy, moist heat, patient/family education, cognitive remediation/compensation, coping strategies training, and DME and/or AE instructions  RECOMMENDED OTHER SERVICES: Currently has PT visit this week.  CONSULTED AND AGREED WITH PLAN OF CARE: Patient and family member/caregiver  PLAN FOR NEXT SESSION:  Follow up re: splint (donning and doffing, able to wash padding?) Progress HEP for PROM of L  UE Memory binder exploration/education Safety Education  Delana Meyer, OT 04/23/2023, 9:30 AM

## 2023-04-26 ENCOUNTER — Ambulatory Visit: Payer: MEDICAID | Admitting: Physical Therapy

## 2023-04-26 DIAGNOSIS — M6281 Muscle weakness (generalized): Secondary | ICD-10-CM

## 2023-04-26 DIAGNOSIS — M5459 Other low back pain: Secondary | ICD-10-CM

## 2023-04-26 DIAGNOSIS — M79605 Pain in left leg: Secondary | ICD-10-CM

## 2023-04-26 DIAGNOSIS — R2689 Other abnormalities of gait and mobility: Secondary | ICD-10-CM

## 2023-04-26 DIAGNOSIS — R2681 Unsteadiness on feet: Secondary | ICD-10-CM

## 2023-04-26 DIAGNOSIS — M24542 Contracture, left hand: Secondary | ICD-10-CM | POA: Diagnosis not present

## 2023-04-26 NOTE — Therapy (Signed)
OUTPATIENT PHYSICAL THERAPY NEURO TREATMENT - RECERTIFICATION   Patient Name: Roberto Manning MRN: [id number] DOB:10-18-1995, 27 y.o., male Today's Date: 04/26/2023   PCP: Benetta Spar, MD REFERRING PROVIDER: Merrily Brittle, DO  END OF SESSION:  PT End of Session - 04/26/23 0801     Visit Number 6    Number of Visits 10   recert   Date for PT Re-Evaluation 06/21/23   recert, to allow for scheduling delays   Authorization Type [employer]    Progress Note Due on Visit 10    PT Start Time 0800    PT Stop Time 0845    PT Time Calculation (min) 45 min    Equipment Utilized During Treatment Gait belt    Activity Tolerance Patient tolerated treatment well    Behavior During Therapy [facility] for tasks assessed/performed;Impulsive                 Past Medical History:  Diagnosis Date   ADHD    Cerebral palsy (HCC)    Seizures (HCC)    Past Surgical History:  Procedure Laterality Date   HIP PINNING     Patient Active Problem List   Diagnosis Date Noted   ADHD 04/20/2020   Seizures (HCC) 06/06/2017   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   Neutropenia (HCC) 04/28/2016   Cerebral palsy (HCC) 04/19/2016   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   Seizure disorder (HCC) 07/06/2012   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    ONSET DATE: 01/21/2023  REFERRING DIAG: 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 M54.50 (ICD-10-CM) - Low back pain, unspecified R26.81 (ICD-10-CM) - Unsteadiness on feet  THERAPY DIAG:  Muscle weakness (generalized)  Unsteadiness on feet  Other abnormalities of gait and mobility  Other low back pain  Pain in left leg  Rationale for Evaluation and Treatment: Rehabilitation  SUBJECTIVE:                                                                                                                                                                                              SUBJECTIVE STATEMENT: Pt reports new onset of R knee pain this morning (no injury), still having back pain but feels like it is getting better. Pt reports pain in LLE is better as well, not bothering him this morning.  Pt accompanied by: self and family member mom  PERTINENT HISTORY: Seizure disorder, ADHD, CP  PAIN:  Are you having pain? Yes: NPRS scale: 5/10 Pain location: L leg and back Pain description: sharp Aggravating factors:  when its about to rain, when I get too cold Relieving factors: nothing (has tried OTC meds and mm relaxers)  PRECAUTIONS: Fall and Other: seizures (last seizure was Jan 2024)  WEIGHT BEARING RESTRICTIONS: No  FALLS: Has patient fallen in last 6 months? Yes. Number of falls falls at least once per day to his L side (overheats and falls)  LIVING ENVIRONMENT: Lives with: lives with their family Lives in: House/apartment Stairs: Yes: Internal: 12 steps; on right going up Has following equipment at home: Walker - 2 wheeled  PLOF: Independent with gait and Independent with transfers  PATIENT GOALS: "trying to get myself better" "get my leg strength back so I can get back active"  OBJECTIVE:   DIAGNOSTIC FINDINGS:  Thoracic Spine Xray 01/21/23 FINDINGS: Lower thoracic scoliosis is apex to the left, centered at approximately T9. On the AP film, the pedicles are intact. No hemivertebra or segmentation anomaly is identified. The upper thoracic spine is obscured on the lateral and swimmer's views due to overlying skeletal and soft tissue structures. Visualized thoracic vertebral body heights and anteroposterior alignment are maintained. No definite fractures are visualized. Spinous processes in the lower thoracic spine are obscured by overlying ribs.  IMPRESSION:  1. Similar lower thoracic scoliosis.  2. No acute fractures are visualized, with limitations as above.  COGNITION: Overall cognitive  status: History of cognitive impairments - at baseline   SENSATION: Occasional N/T in LE Light touch WFL  MUSCLE TONE: clonus in BLE   POSTURE: rounded shoulders, forward head, posterior pelvic tilt, and flexed trunk   LOWER EXTREMITY ROM:     Passive  Right eval Left eval  Hip flexion    Hip extension    Hip abduction    Hip adduction    Hip internal rotation    Hip external rotation    Knee flexion    Knee extension  -10 degrees  Ankle dorsiflexion    Ankle plantarflexion    Ankle inversion    Ankle eversion     (Blank rows = not tested)     LOWER EXTREMITY MMT:    MMT Right Eval Left Eval  Hip flexion 5 4  Hip extension    Hip abduction    Hip adduction    Hip internal rotation    Hip external rotation    Knee flexion 5 3  Knee extension 4 3  Ankle dorsiflexion 5 3  Ankle plantarflexion    Ankle inversion    Ankle eversion    (Blank rows = not tested)   PATIENT SURVEYS: Initial Eval: Modified Oswestry 29/50  LEFS 30/80   TODAY'S TREATMENT:    TherAct Assessed R patellar mobility due to pt complaints of new onset of R knee pain this date. Pt with some edema of his knee joint. Pt noted to have pain with medial patellar glide glide, muscle guarding with superior and inferior glides with decreased ROM in those directions noted. Pt encouraged to keep moving his knee, use ice, and continue to monitor edema of joint.   Reassessed for LTG assessment: Oswestry: 28/50 LEFS: 14/80  TherEx Supine modified thomas stretch for L hip, 2 x 30 sec each  Seated anterior leans on blue Swiss ball x 5 reps Seated anteriolateral leans on blue Swiss ball x 3 reps B  Standing thoracic and lumbar stretches with unweighted ball: Trunk diagonal rotations x 10 reps each direction Trunk flexion/extension x 10 reps each direction    PATIENT EDUCATION: Education details:  continue HEP, purchase Lofstrand crutch, PT POC, see above regarding R knee Person educated: Patient and mom Education method: Explanation Education comprehension: verbalized understanding and needs further education  HOME EXERCISE PROGRAM: Access Code: [email or web address] URL: [email or web address] Date: 03/15/2023 Prepared by: Peter Congo  Exercises - Long Sitting Hamstring Stretch  - 1 x daily - 7 x weekly - 1 sets - 3-5 reps - Hooklying Single Knee to Chest Stretch with Towel  - 1 x daily - 7 x weekly - 1 sets - 3-5 reps - Sit to Stand with Resistance Around Legs  - 1 x daily - 7 x weekly - 3 sets - 10 reps - Supine Bridge with Resistance Band  - 1 x daily - 7 x weekly - 3 sets - 10 reps - Seated Piriformis Stretch  - 1 x daily - 7 x weekly - 1 sets - 3-5 reps - 30 sec hold - Supine Lower Trunk Rotation  - 1 x daily - 7 x weekly - 3 sets - 10 reps   GOALS: Goals reviewed with patient? Yes  SHORT TERM GOALS: Target date: 03/22/2023  Pt will be independent with initial HEP for improved strength, balance, transfers and gait. Baseline: Goal status: GOAL MET   LONG TERM GOALS: Target date: 05/03/2023 (updated to match last scheduled appt within POC)   Pt will be independent with final HEP for improved strength, balance, transfers and gait. Baseline:  Goal status: IN PROGRESS  2.  Pt will improve his score to </= 25/50 on the Oswestry to demonstrate decreased disability level Baseline: 29/50 (7/5), 28/50 (9/6) Goal status: IN PROGRESS  3.  Pt will improve his score on the LEFS to 35/80 to demonstrate decreased disability level Baseline: 30/80 (7/5), 14/80 (9/6) Goal status: NOT MET  4.  Pt will improve 5 x STS to less than or equal to 15 seconds to demonstrate improved functional strength and transfer efficiency.  Baseline: 20 sec (7/5), 19.18 sec (8/23) Goal status: IN PROGRESS  5.  Pt will improve gait velocity to at least 3.0 ft/sec for improved gait efficiency  and performance at mod I level  Baseline: 2.73 ft/sec mod I (7/5), 3.57 ft/sec mod I (8/23) Goal status: MET   NEW SHORT TERM GOALS=LONG TERM GOALS due to length of POC   NEW LONG TERM GOALS:  Target date: 06/07/2023  Pt will be independent with final HEP for improved strength, balance, transfers and gait. Baseline: established but will continue to be added to during POC Goal status: IN PROGRESS  2.  Pt will improve his score to </= 25/50 on the Oswestry to demonstrate decreased disability level Baseline: 29/50 (7/5), 28/50 (9/6) Goal status: IN PROGRESS  3.  Pt will improve his score on the LEFS to 35/80 to demonstrate decreased disability level Baseline: 30/80 (7/5), 14/80 (9/6) Goal status: IN PROGRESS  4.  Pt will improve 5 x STS to less than or equal to 15 seconds to demonstrate improved functional strength and transfer efficiency.  Baseline: 20 sec (7/5), 19.18 sec (8/23) Goal status: IN PROGRESS    ASSESSMENT:  CLINICAL IMPRESSION: Emphasis  of skilled PT session on reassessing LTG in preparation for recertification of PT services, assessing R knee pain, and continuing to work on stretches for back and LLE to address ongoing pain and dysfunction. Pt met 1/5 LTG assessed and is making progress towards 3/5 LTG. Unable to reassess 5xSTS due to time constraints during session. Pt continues to be limited by ongoing chronic back pain and LLE pain due to PMH of CP as well as scoliosis. Pt continues to benefit from skilled therapy services to work towards increasing his independence with management of pain symptoms, decreasing his fall risk, increasing his safety and independence with functional mobility. Continue POC.   OBJECTIVE IMPAIRMENTS: Abnormal gait, decreased balance, decreased cognition, decreased coordination, decreased knowledge of use of DME, difficulty walking, decreased ROM, decreased strength, increased muscle spasms, impaired flexibility, impaired sensation, impaired  tone, impaired UE functional use, improper body mechanics, postural dysfunction, and pain.   ACTIVITY LIMITATIONS: carrying, lifting, bending, standing, squatting, stairs, transfers, bathing, and dressing  PARTICIPATION LIMITATIONS: cleaning, interpersonal relationship, and community activity  PERSONAL FACTORS: Time since onset of injury/illness/exacerbation and 1-2 comorbidities: seizure disorder, ADHD, and CP  are also affecting patient's functional outcome.   REHAB POTENTIAL: Fair chronic history of CP  CLINICAL DECISION MAKING: Stable/uncomplicated  EVALUATION COMPLEXITY: Low  PLAN:  PT FREQUENCY: 1x/week  PT DURATION:  7 total sessions with eval + 4 sessions (recert)  PLANNED INTERVENTIONS: Therapeutic exercises, Therapeutic activity, Neuromuscular re-education, Balance training, Gait training, Patient/Family education, Self Care, Joint mobilization, Stair training, Orthotic/Fit training, DME instructions, Dry Needling, Electrical stimulation, Cryotherapy, Moist heat, Taping, Manual therapy, and Re-evaluation  PLAN FOR NEXT SESSION: modify HEP prn for LLE stretching, LLE strengthening, core stretching (side-bending or overhead reaching stretch?) and then strengthening/stability, postural stabilization, did pt get Lofstrand crutch?, assess R knee cap again next session  Check all possible CPT codes: 56213 - PT Re-evaluation, 97110- Therapeutic Exercise, [phone]- Neuro Re-education, [phone] - Gait Training, [phone] - Manual Therapy, 97530 - Therapeutic Activities, [phone] - Self Care, [phone] - Electrical stimulation (Manual), [phone] - Orthotic Fit, and [id number] - Aquatic therapy    Check all conditions that are expected to impact treatment: {Conditions expected to impact treatment:Contractures, spasticity or fracture relevant to requested treatment, Neurological condition and/or seizures, and Presence of Medical Equipment   If treatment provided at initial evaluation, no treatment charged due to  lack of authorization.        Peter Congo, PT, DPT, CSRS   04/26/2023, 10:28 AM

## 2023-04-29 ENCOUNTER — Ambulatory Visit: Payer: Self-pay | Admitting: Physical Therapy

## 2023-04-29 ENCOUNTER — Encounter: Payer: MEDICAID | Admitting: Occupational Therapy

## 2023-05-06 ENCOUNTER — Ambulatory Visit: Payer: MEDICAID | Admitting: Occupational Therapy

## 2023-05-10 ENCOUNTER — Ambulatory Visit: Payer: MEDICAID | Admitting: Physical Therapy

## 2023-05-10 DIAGNOSIS — M79605 Pain in left leg: Secondary | ICD-10-CM

## 2023-05-10 DIAGNOSIS — R2681 Unsteadiness on feet: Secondary | ICD-10-CM

## 2023-05-10 DIAGNOSIS — M5459 Other low back pain: Secondary | ICD-10-CM

## 2023-05-10 DIAGNOSIS — R2689 Other abnormalities of gait and mobility: Secondary | ICD-10-CM

## 2023-05-10 DIAGNOSIS — M6281 Muscle weakness (generalized): Secondary | ICD-10-CM

## 2023-05-10 DIAGNOSIS — M24542 Contracture, left hand: Secondary | ICD-10-CM | POA: Diagnosis not present

## 2023-05-10 NOTE — Therapy (Signed)
OUTPATIENT PHYSICAL THERAPY NEURO TREATMENT   Patient Name: Roberto Manning MRN: [id number] DOB:[birth year], 27 y.o., male Today's Date: 05/10/2023   PCP: Benetta Spar, MD REFERRING PROVIDER: Merrily Brittle, DO  END OF SESSION:  PT End of Session - 05/10/23 0811     Visit Number 7    Number of Visits 10   recert   Date for PT Re-Evaluation 06/21/23   recert, to allow for scheduling delays   Authorization Type [facility]    Progress Note Due on Visit 10    PT Start Time 0811   pt arrived late   PT Stop Time 0845    PT Time Calculation (min) 34 min    Equipment Utilized During Treatment Gait belt    Activity Tolerance Patient tolerated treatment well    Behavior During Therapy [facility] for tasks assessed/performed;Impulsive                  Past Medical History:  Diagnosis Date   ADHD    Cerebral palsy (HCC)    Seizures (HCC)    Past Surgical History:  Procedure Laterality Date   HIP PINNING     Patient Active Problem List   Diagnosis Date Noted   ADHD 04/20/2020   Seizures (HCC) 06/06/2017   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   Neutropenia (HCC) 04/28/2016   Cerebral palsy (HCC) 04/19/2016   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   Seizure disorder (HCC) 07/06/2012   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    ONSET DATE: 01/21/2023  REFERRING DIAG: 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 M54.50 (ICD-10-CM) - Low back pain, unspecified R26.81 (ICD-10-CM) - Unsteadiness on feet  THERAPY DIAG:  Muscle weakness (generalized)  Unsteadiness on feet  Other abnormalities of gait and mobility  Other low back pain  Pain in left leg  Rationale for Evaluation and Treatment: Rehabilitation  SUBJECTIVE:                                                                                                                                                                                              SUBJECTIVE STATEMENT: Pt reports he almost fell on Sunday, caught himself on the wall and twisted his L ankle. Pt states his L ankle has been hurting him since then, has not had it checked out. Pt also reports his L AFO is bruising his foot and is uncomfortable to wear, says he got the brace in [facility].  Pt states, "I feel like my body is shutting down" over the past few  months and that his legs will give out on him.  Pt accompanied by: self and family member mom  PERTINENT HISTORY: Seizure disorder, ADHD, CP  PAIN:  Are you having pain? Yes: NPRS scale: 5/10 Pain location: L leg and back Pain description: sharp Aggravating factors: when its about to rain, when I get too cold Relieving factors: nothing (has tried OTC meds and mm relaxers)  PRECAUTIONS: Fall and Other: seizures (last seizure was Jan 2024)  WEIGHT BEARING RESTRICTIONS: No  FALLS: Has patient fallen in last 6 months? Yes. Number of falls falls at least once per day to his L side (overheats and falls)  LIVING ENVIRONMENT: Lives with: lives with their family Lives in: House/apartment Stairs: Yes: Internal: 12 steps; on right going up Has following equipment at home: Walker - 2 wheeled  PLOF: Independent with gait and Independent with transfers  PATIENT GOALS: "trying to get myself better" "get my leg strength back so I can get back active"  OBJECTIVE:   DIAGNOSTIC FINDINGS:  Thoracic Spine Xray 01/21/23 FINDINGS: Lower thoracic scoliosis is apex to the left, centered at approximately T9. On the AP film, the pedicles are intact. No hemivertebra or segmentation anomaly is identified. The upper thoracic spine is obscured on the lateral and swimmer's views due to overlying skeletal and soft tissue structures. Visualized thoracic vertebral body heights and anteroposterior alignment are maintained. No definite fractures are visualized. Spinous processes in  the lower thoracic spine are obscured by overlying ribs.  IMPRESSION:  1. Similar lower thoracic scoliosis.  2. No acute fractures are visualized, with limitations as above.  COGNITION: Overall cognitive status: History of cognitive impairments - at baseline   SENSATION: Occasional N/T in LE Light touch WFL  MUSCLE TONE: clonus in BLE   POSTURE: rounded shoulders, forward head, posterior pelvic tilt, and flexed trunk   LOWER EXTREMITY ROM:     Passive  Right eval Left eval  Hip flexion    Hip extension    Hip abduction    Hip adduction    Hip internal rotation    Hip external rotation    Knee flexion    Knee extension  -10 degrees  Ankle dorsiflexion    Ankle plantarflexion    Ankle inversion    Ankle eversion     (Blank rows = not tested)     LOWER EXTREMITY MMT:    MMT Right Eval Left Eval  Hip flexion 5 4  Hip extension    Hip abduction    Hip adduction    Hip internal rotation    Hip external rotation    Knee flexion 5 3  Knee extension 4 3  Ankle dorsiflexion 5 3  Ankle plantarflexion    Ankle inversion    Ankle eversion    (Blank rows = not tested)   PATIENT SURVEYS: Initial Eval: Modified Oswestry 29/50  LEFS 30/80   TODAY'S TREATMENT:    TherAct Pt with some tenderness and redness on L foot medially where brace is causing him discomfort. Encouraged pt and his mom to reach out to orthotist who provided him with the brace to have it adjusted as it should not be causing discomfort or skin breakdown.  Assessed L ankle following fall and complaint of pain in ankle with twisting during fall. Pt noted to have no eversion of L ankle, stuck in inversion, no edema noted, pt able to be brought into neutral by dorsiflexion of ankle. No pain with PROM of joint. Encouraged pt and  his mom to follow up with PCP or go to an Urgent Care if pain persists but no swelling noted in joint and joint not painful with PROM, only in WB.  Standing reaching at  mirror outside BOS and across midline for targets (sticky notes with letters A-E) with CGA for balance. Added in stance on airex for increased challenge, CGA to min A. Pt needs cues to not hold onto bar for stability.  To work on increasing step height and step length B: Ambulation through 4" hurdles 6 x 10 ft with CGA Ambulation through 6" hurdles 6 x 10 ft with CGA Focus on decreased circumduction with CGA for balance                                                                                              PATIENT EDUCATION: Education details: continue HEP, purchase Lofstrand crutch, PT POC, see above regarding L ankle and custom L AFO Person educated: Patient and mom Education method: Explanation Education comprehension: verbalized understanding and needs further education  HOME EXERCISE PROGRAM: Access Code: [email or web address] URL: [email or web address] Date: 03/15/2023 Prepared by: Peter Congo  Exercises - Long Sitting Hamstring Stretch  - 1 x daily - 7 x weekly - 1 sets - 3-5 reps - Hooklying Single Knee to Chest Stretch with Towel  - 1 x daily - 7 x weekly - 1 sets - 3-5 reps - Sit to Stand with Resistance Around Legs  - 1 x daily - 7 x weekly - 3 sets - 10 reps - Supine Bridge with Resistance Band  - 1 x daily - 7 x weekly - 3 sets - 10 reps - Seated Piriformis Stretch  - 1 x daily - 7 x weekly - 1 sets - 3-5 reps - 30 sec hold - Supine Lower Trunk Rotation  - 1 x daily - 7 x weekly - 3 sets - 10 reps   GOALS: Goals reviewed with patient? Yes   NEW SHORT TERM GOALS=LONG TERM GOALS due to length of POC   NEW LONG TERM GOALS:  Target date: 06/07/2023  Pt will be independent with final HEP for improved strength, balance, transfers and gait. Baseline: established but will continue to be added to during POC Goal status: IN PROGRESS  2.  Pt will improve his score to </= 25/50 on the Oswestry to demonstrate decreased disability level Baseline: 29/50 (7/5), 28/50  (9/6) Goal status: IN PROGRESS  3.  Pt will improve his score on the LEFS to 35/80 to demonstrate decreased disability level Baseline: 30/80 (7/5), 14/80 (9/6) Goal status: IN PROGRESS  4.  Pt will improve 5 x STS to less than or equal to 15 seconds to demonstrate improved functional strength and transfer efficiency.  Baseline: 20 sec (7/5), 19.18 sec (8/23) Goal status: IN PROGRESS    ASSESSMENT:  CLINICAL IMPRESSION: Emphasis of skilled PT session on assessing L ankle and L foot following complaints of twisting L ankle during a fall over the weekend as well as complaints of his brace causing pain in his L foot. Pt with no edema in his ankle and no pain with  PROM but encouraged him and his mom to seek further medical care if pain persists. Additionally, pt noted to have some redness and hardening of skin on dorsum of L foot where his custom L AFO is rubbing. Encouraged him and his mom to reach out to his orthotist to get his brace adjusted. Continued to work on stretching and rotation of thoracic and lumbar reaching with reaching task as well as standing balance and obstacle navigation. Pt continues to benefit from skilled therapy services to work towards increased independence with management of pain symptoms and increased safety and independence with functional mobility. Continue POC.   OBJECTIVE IMPAIRMENTS: Abnormal gait, decreased balance, decreased cognition, decreased coordination, decreased knowledge of use of DME, difficulty walking, decreased ROM, decreased strength, increased muscle spasms, impaired flexibility, impaired sensation, impaired tone, impaired UE functional use, improper body mechanics, postural dysfunction, and pain.   ACTIVITY LIMITATIONS: carrying, lifting, bending, standing, squatting, stairs, transfers, bathing, and dressing  PARTICIPATION LIMITATIONS: cleaning, interpersonal relationship, and community activity  PERSONAL FACTORS: Time since onset of  injury/illness/exacerbation and 1-2 comorbidities: seizure disorder, ADHD, and CP  are also affecting patient's functional outcome.   REHAB POTENTIAL: Fair chronic history of CP  CLINICAL DECISION MAKING: Stable/uncomplicated  EVALUATION COMPLEXITY: Low  PLAN:  PT FREQUENCY: 1x/week  PT DURATION:  7 total sessions with eval + 4 sessions (recert)  PLANNED INTERVENTIONS: Therapeutic exercises, Therapeutic activity, Neuromuscular re-education, Balance training, Gait training, Patient/Family education, Self Care, Joint mobilization, Stair training, Orthotic/Fit training, DME instructions, Dry Needling, Electrical stimulation, Cryotherapy, Moist heat, Taping, Manual therapy, and Re-evaluation  PLAN FOR NEXT SESSION: modify HEP prn for LLE stretching, LLE strengthening, core stretching (side-bending or overhead reaching stretch?) and then strengthening/stability, postural stabilization, did pt get Lofstrand crutch?, how is L ankle? Did he reach out to orthotist? D/c this session?  Check all possible CPT codes: 09811 - PT Re-evaluation, 97110- Therapeutic Exercise, [phone]- Neuro Re-education, [phone] - Gait Training, [phone] - Manual Therapy, [phone] - Therapeutic Activities, [phone] - Self Care, [phone] - Electrical stimulation (Manual), [phone] - Orthotic Fit, and [phone] - Aquatic therapy    Check all conditions that are expected to impact treatment: {Conditions expected to impact treatment:Contractures, spasticity or fracture relevant to requested treatment, Neurological condition and/or seizures, and Presence of Medical Equipment   If treatment provided at initial evaluation, no treatment charged due to lack of authorization.        Peter Congo, PT, DPT, CSRS   05/10/2023, 8:46 AM

## 2023-05-13 ENCOUNTER — Ambulatory Visit: Payer: MEDICAID | Admitting: Occupational Therapy

## 2023-05-13 DIAGNOSIS — M24542 Contracture, left hand: Secondary | ICD-10-CM | POA: Diagnosis not present

## 2023-05-13 DIAGNOSIS — R29898 Other symptoms and signs involving the musculoskeletal system: Secondary | ICD-10-CM

## 2023-05-13 DIAGNOSIS — M6281 Muscle weakness (generalized): Secondary | ICD-10-CM

## 2023-05-13 NOTE — Therapy (Unsigned)
OUTPATIENT OCCUPATIONAL THERAPY NEURO TREATMENT  Patient Name: Niguel Hargan MRN: [id number] DOB:April 16, 1996, 27 y.o., male Today's Date: 05/13/2023  PCP: Benetta Spar, MD REFERRING PROVIDER: Merrily Brittle, DO  END OF SESSION:  OT End of Session - 05/13/23 0934     Visit Number 4    Number of Visits 6   + evaluation   Date for OT Re-Evaluation 05/24/23    Authorization Type [employer] - Auth Reqd    OT Start Time [phone]    OT Stop Time 1015    OT Time Calculation (min) 41 min    Activity Tolerance Patient tolerated treatment well    Behavior During Therapy [facility] for tasks assessed/performed             Past Medical History:  Diagnosis Date   ADHD    Cerebral palsy (HCC)    Seizures (HCC)    Past Surgical History:  Procedure Laterality Date   HIP PINNING     Patient Active Problem List   Diagnosis Date Noted   ADHD 04/20/2020   Seizures (HCC) 06/06/2017   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   Neutropenia (HCC) 04/28/2016   Cerebral palsy (HCC) 04/19/2016   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   Seizure disorder (HCC) 07/06/2012   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  He did bring the binder to therapy today and reports he is going to do his exercises this week.   Pt accompanied by: self  PERTINENT HISTORY:  01/21/23: XR lumbar spine - Increased dextroscoliosis of  the lumbar spine since previous, centered at L2/L3.   PRECAUTIONS: Fall and Other: Seizures  WEIGHT BEARING RESTRICTIONS: No  PAIN:  Are you having pain? Yes: NPRS scale: 8-9/10 Pain location: lL foot s/p twisitng it on the stairs a week ago  Pain description: aching in foot Aggravating factors: twisted it rushing Relieving factors: wearing his brace, stayin goff his foot, heating pad   FALLS: Has patient fallen in last 6 months? Yes. Number of falls has been falling a lot - reports he fell this morning coming into the house.  He stayed at his mom's last night and forgot to take his medicine with him and hasn't been using his walker.  LIVING ENVIRONMENT: Lives with: lives with their family and [name] (sister) Lives in: apartment/townhouse Stairs: Yes: Internal: 1 flight - bedroom/shower upstairs steps; on left going up Has following equipment at home: Dan Humphreys - 2 wheeled and Tour manager  PLOF: Independent with basic ADLs  PATIENT GOALS: To get his splint fixed for his L hand.  OBJECTIVE:   HAND DOMINANCE: Right  ADLs: Overall ADLs: Limited assistance from family members but generally Mod I with BADLs Transfers/ambulation related to ADLs: Mod I with and without AE Eating: maybe help to cut steak but otherwise he does it himself Grooming: Brother cuts his hair and shaves him UB Dressing: Mod I LB Dressing: Mod I - including LLE brace and slip-on shoes Toileting: Mod I Bathing: Sister will  help with bathing on occasion ie) to wash his back and under the L arm Tub Shower transfers: Sometimes supervised but generally on his own. Equipment: Emergency planning/management officer and Grab bars  IADLs: Shopping: Goes with his mom Light housekeeping: Often gets help from family Meal Prep: Sister helps with cooking Community mobility: Mod Ind - occasionally uses his walker Medication management: Sister helps - just got a pill box Financial management: Family does this Handwriting:  NT  MOBILITY  STATUS: Independent and Hx of falls  POSTURE COMMENTS:  rounded shoulders, forward head, posterior pelvic tilt, flexed trunk , and Scoliotic Sitting balance: WFL  ACTIVITY TOLERANCE: Activity tolerance: Good in sitting.  FUNCTIONAL OUTCOME MEASURES: [place] IADL Scale: A summary score ranges from 0 (low function, dependent) to 8 (high function, independent) for women  and 0 through 5 for men to avoid potential gender bias.     UPPER EXTREMITY ROM:    Active ROM Right eval Left eval  Shoulder flexion WFL -25 %  Shoulder abduction  -25 %  Shoulder adduction    Shoulder extension  -25 %  Shoulder internal rotation    Shoulder external rotation    Elbow flexion  WFL  Elbow extension  -50 deg  Wrist flexion  hyperflexed  Wrist extension  35 deg from neutral  Wrist ulnar deviation    Wrist radial deviation    Wrist pronation    Wrist supination  -50 %  (Blank rows = not tested)  UPPER EXTREMITY MMT:     MMT Right eval Left eval  Shoulder flexion 4+/5 4-/5  Shoulder abduction    Shoulder adduction    Shoulder extension    Shoulder internal rotation    Shoulder external rotation    Middle trapezius    Lower trapezius    Elbow flexion  3+/5  Elbow extension  3-/5  Wrist flexion  4/5  Wrist extension  2-/5  Wrist ulnar deviation    Wrist radial deviation    Wrist pronation    Wrist supination  3-/5  (Blank rows = not tested)  HAND FUNCTION: Grip strength: Right: 46.0, 63.2, 53.1 lbs; Left: 15.2, 9.9, 20.5  lbs Averages Right: 54.1 lbs Left: 15.2 lbs (excessive wrist flexion and combined with elbow flexion)  COORDINATION: NT at evaluation  SENSATION: Light touch: Impaired  - some numbness in L hand/fingers on occasion  EDEMA: NA  MUSCLE TONE: LUE: Hypertonic  COGNITION: Overall cognitive status: History of cognitive impairments - at baseline and memory and choices ie) needing to refer to family (mom/sister) for memory, forgot medication when staying  with mom yesterday and then fell this morning  VISION: Subjective report: No problems Baseline vision: No visual deficits Visual history: NA  VISION ASSESSMENT: Not tested  PERCEPTION: Not tested  PRAXIS: Not tested  OBSERVATIONS: Pleasant, talkative young gentleman with CP and L sided dysfunction, scoliotic stance with mobility without RW today.   TODAY'S TREATMENT:                                                                                                                              -  Therapeutic Exercises Reviewed previous HEP exercises from previous sessions incluidng:  - Wrist Prayer Stretch  -encouraged to get his L wrist to neutral at least with fingers as straight as possible as extension is still difficult - Forearm Supination PROM  - encouraged to roll forearm over with wrist stretched out as able - Supported Elbow Flexion Extension PROM    Added new exercises to HEP and provided demonstration, assistance and guidance for self performance with return demonstration sought. Encouraged to work on elbow extension with all motions. - Supine Shoulder Flexion Extension AAROM with Dowel  - encouraged to slow down and move arms overhead together - Supine Shoulder Press with Dowel  - encouraged to slow down and extend arm as much as possible - Supine Shoulder Abduction AAROM with Dowel  - worked on moving side to side (crossing midline) with L hand/arm extending out to he L and then crossing his body to the R  Therapeutic Activities   Reviewed and assisted with updating Memory Binder.  Patient has HEPs in page protectors and new exercises are added with pages replaced as needed and lists of exercises visible for him to carryover at home.   PATIENT EDUCATION: Education details: Progression of ROM program Person educated: Patient Education method: Explanation, Demonstration, Tactile cues, Verbal cues, and Handouts Education comprehension: verbalized understanding, returned  demonstration, verbal cues required, tactile cues required, and needs further education  HOME EXERCISE PROGRAM: 04/15/23: Wrist PROM stretches Access Code: [email or web address] 05/14/23: Shoulder ROM - same access code   GOALS: Goals reviewed with patient? Yes  SHORT TERM GOALS: Target date: 05/03/23  Patient will demonstrate ROM HEP with 25% verbal cues or less for proper execution. Baseline: No formal HEP and LUE hypertonicity Goal status: IN Progress  2.  Patient will demonstrate LUE splint application with min assist. Baseline: Not wearing splint - missing pin to lock wrist in place Goal status: IN Progress  3. Pt will verbalize and demonstrate understanding of ways to keep thinking skills sharp and ways to compensate for STM changes including use of Memory Binder. Baseline: not yet initiated Goal status: IN Progress  4.  Pt will be able to reports 3+ ideas for safety recommendations to decrease fall risk Baseline: Multiple falls Goal status: INITIAL   LONG TERM GOALS: Target date: 05/24/23  Patient will demonstrate MI with HEP for contracture management.  Baseline: No formal HEP and LUE hypertonicity Goal status: IN Progress  2.  Patient will demonstrate ModI with LUE splint application Baseline: Not wearing splint - missing pin to lock wrist in place Goal status: IN Progress  3.  Patient will be able to sort his own medication into pill box weekly with supervision Baseline: Completed by sister. Goal status: INITIAL  4.  Patient will be able to prepare simple foods safely in the [facility] will list of 5-10 items he can prepare in his Memory binder. Baseline: Family prepares meals. Goal status: INITIAL Pizza rolls in microwave, noodles (microwave/stove), sandwich,    ASSESSMENT:  CLINICAL IMPRESSION: Patient will require repeat education for independent carry-over of HEP and splint donning, doffing, and adjustment.  He remains motivated for therapy ie) frequently stating  he can't miss but may need further visual list/cues to help with daily carryover.   PERFORMANCE DEFICITS: in functional skills including ADLs, IADLs, tone, ROM, fascial restrictions, muscle spasms, flexibility, Fine motor control, Gross motor control, mobility, balance, decreased knowledge of precautions, decreased knowledge of use of DME, and UE functional use, cognitive skills including attention,  learn, memory, problem solving, safety awareness, sequencing, temperament/personality, thought, and understand, and psychosocial skills including coping strategies, interpersonal interactions, and routines and behaviors.   IMPAIRMENTS: are limiting patient from ADLs and IADLs.   CO-MORBIDITIES: may have co-morbidities  that affects occupational performance. Patient will benefit from skilled OT to address above impairments and improve overall function.  REHAB POTENTIAL: Good   PLAN:  OT FREQUENCY: 1x/week  OT DURATION: 6 weeks  PLANNED INTERVENTIONS: self care/ADL training, therapeutic activity, neuromuscular re-education, manual therapy, passive range of motion, splinting, ultrasound, fluidotherapy, moist heat, patient/family education, cognitive remediation/compensation, coping strategies training, and DME and/or AE instructions  RECOMMENDED OTHER SERVICES: Currently has PT visit this week.  CONSULTED AND AGREED WITH PLAN OF CARE: Patient and family member/caregiver  PLAN FOR NEXT SESSION:  Follow up re: splint (donning and doffing, able to wash padding?) Progress HEP for PROM of L UE Memory binder exploration/education Safety Education  Victorino Sparrow, OT 05/13/2023, 12:11 PM

## 2023-05-13 NOTE — Patient Instructions (Signed)
Access Code: [email or web address] URL: [email or web address] Date: 05/13/2023 Prepared by: Amada Kingfisher  Exercises - Wrist Prayer Stretch  - 2-3 x daily - 1 sets - 5-10 reps - Forearm Supination PROM  - 1-2 x daily - 1 sets - 5-10 reps - Supported Elbow Flexion Extension PROM  - 1 x daily - 1-2 sets - 5-10 reps - Supine Shoulder Flexion Extension AAROM with Dowel  - 1 x daily - 10 reps - Supine Shoulder Press with Dowel  - 1 x daily - 10 reps - Supine Shoulder Abduction AAROM with Dowel  - 1 x daily - 10 reps

## 2023-05-20 ENCOUNTER — Ambulatory Visit: Payer: MEDICAID | Admitting: Occupational Therapy

## 2023-05-20 DIAGNOSIS — M24542 Contracture, left hand: Secondary | ICD-10-CM | POA: Diagnosis not present

## 2023-05-20 DIAGNOSIS — R2681 Unsteadiness on feet: Secondary | ICD-10-CM

## 2023-05-20 DIAGNOSIS — R41844 Frontal lobe and executive function deficit: Secondary | ICD-10-CM

## 2023-05-20 NOTE — Patient Instructions (Signed)

## 2023-05-20 NOTE — Therapy (Signed)
OUTPATIENT OCCUPATIONAL THERAPY NEURO TREATMENT & DISCHARGE SUMMARY  Patient Name: Roberto Manning MRN: [id number] DOB:01-Apr-1996, 27 y.o., male Today's Date: 05/20/2023  PCP: Benetta Spar, MD REFERRING PROVIDER: Merrily Brittle, DO  END OF SESSION:  OT End of Session - 05/20/23 0935     Visit Number 5    Number of Visits 6   + evaluation   Date for OT Re-Evaluation 05/24/23    Authorization Type [employer] - Auth Reqd    OT Start Time 0935    OT Stop Time 1015    OT Time Calculation (min) 40 min    Equipment Utilized During Treatment Memory Binder    Activity Tolerance Patient tolerated treatment well    Behavior During Therapy [facility] for tasks assessed/performed             Past Medical History:  Diagnosis Date   ADHD    Cerebral palsy (HCC)    Seizures (HCC)    Past Surgical History:  Procedure Laterality Date   HIP PINNING     Patient Active Problem List   Diagnosis Date Noted   ADHD 04/20/2020   Seizures (HCC) 06/06/2017   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   Neutropenia (HCC) 04/28/2016   Cerebral palsy (HCC) 04/19/2016   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   Seizure disorder (HCC) 07/06/2012   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   Pt accompanied by: self  PERTINENT HISTORY:  01/21/23: XR lumbar spine - Increased dextroscoliosis of the lumbar spine since  previous, centered at L2/L3.   PRECAUTIONS: Fall and Other: Seizures  WEIGHT BEARING RESTRICTIONS: No  PAIN:  Are you having pain? Yes: NPRS scale: 8-9/10 Pain location: lL foot s/p twisitng it on the stairs a week ago  Pain description: aching in foot Aggravating factors: twisted it rushing Relieving factors: wearing his brace, stayin goff his foot, heating pad   FALLS: Has patient fallen in last 6 months? Yes. Number of falls has been falling a lot - reports he fell this morning coming into the house.  He stayed at his mom's last night and forgot to take his medicine with him and hasn't been using his walker.  LIVING ENVIRONMENT: Lives with: lives with their family and [name] (sister) Lives in: apartment/townhouse Stairs: Yes: Internal: 1 flight - bedroom/shower upstairs steps; on left going up Has following equipment at home: Dan Humphreys - 2 wheeled and Tour manager  PLOF: Independent with basic ADLs  PATIENT GOALS: To get his splint fixed for his L hand.  OBJECTIVE:   HAND DOMINANCE: Right  ADLs: Overall ADLs: Limited assistance from family members but generally Mod I with BADLs Transfers/ambulation related to ADLs: Mod I with and without AE Eating: maybe help to cut steak but otherwise he does it himself Grooming: Brother cuts his hair and shaves him UB Dressing: Mod I LB Dressing: Mod I - including LLE brace and slip-on shoes Toileting: Mod I Bathing: Sister will help with bathing on occasion ie)  to wash his back and under the L arm Tub Shower transfers: Sometimes supervised but generally on his own. Equipment: Emergency planning/management officer and Grab bars  IADLs: Shopping: Goes with his mom Light housekeeping: Often gets help from family Meal Prep: Sister helps with cooking Community mobility: Mod Ind - occasionally uses his walker Medication management: Sister helps - just got a pill box Financial management: Family does this Handwriting:  NT  MOBILITY STATUS: Independent and  Hx of falls  POSTURE COMMENTS:  rounded shoulders, forward head, posterior pelvic tilt, flexed trunk , and Scoliotic Sitting balance: WFL  ACTIVITY TOLERANCE: Activity tolerance: Good in sitting.  FUNCTIONAL OUTCOME MEASURES: [place] IADL Scale: A summary score ranges from 0 (low function, dependent) to 8 (high function, independent) for women  and 0 through 5 for men to avoid potential gender bias.     UPPER EXTREMITY ROM:    Active ROM Right eval Left eval  Shoulder flexion WFL -25 %  Shoulder abduction  -25 %  Shoulder adduction    Shoulder extension  -25 %  Shoulder internal rotation    Shoulder external rotation    Elbow flexion  WFL  Elbow extension  -50 deg  Wrist flexion  hyperflexed  Wrist extension  35 deg from neutral  Wrist ulnar deviation    Wrist radial deviation    Wrist pronation    Wrist supination  -50 %  (Blank rows = not tested)  UPPER EXTREMITY MMT:     MMT Right eval Left eval  Shoulder flexion 4+/5 4-/5  Shoulder abduction    Shoulder adduction    Shoulder extension    Shoulder internal rotation    Shoulder external rotation    Middle trapezius    Lower trapezius    Elbow flexion  3+/5  Elbow extension  3-/5  Wrist flexion  4/5  Wrist extension  2-/5  Wrist ulnar deviation    Wrist radial deviation    Wrist pronation    Wrist supination  3-/5  (Blank rows = not tested)  HAND FUNCTION: Grip strength: Right: 46.0, 63.2, 53.1 lbs; Left: 15.2, 9.9, 20.5  lbs Averages Right: 54.1 lbs Left: 15.2 lbs (excessive wrist flexion and combined with elbow flexion)  COORDINATION: NT at evaluation  SENSATION: Light touch: Impaired  - some numbness in L hand/fingers on occasion  EDEMA: NA  MUSCLE TONE: LUE: Hypertonic  COGNITION: Overall cognitive status: History of cognitive impairments - at baseline and memory and choices ie) needing to refer to family (mom/sister) for memory, forgot medication when staying with mom yesterday and then  fell this morning  VISION: Subjective report: No problems Baseline vision: No visual deficits Visual history: NA  VISION ASSESSMENT: Not tested  PERCEPTION: Not tested  PRAXIS: Not tested  OBSERVATIONS: Pleasant, talkative young gentleman with CP and L sided dysfunction, scoliotic stance with mobility without RW today.   TODAY'S TREATMENT:                                                                                                                              --  Therapeutic Activities/Orthotic Management Pt brought memory binder and LUE orthosis with him today. Pt benefitted from intermittent v/c throughout session to improve attention to therapeutic tasks and edu. Pt demo'd some difficulty donning LUE orthosis, therefore OT provided education regarding donning orthosis to increase pt independence and to improve orthosis fit. Pt benefitted from min assist and max v/c to don orthosis correctly. Pt reports wearing orthosis "sometimes" at home. OT and pt collaborated to create list of pt's preferred dishes and required equipment for cooking. Pt reported difficulty with using a can opener because "it is hard." OT provided education regarding obtaining/using an electric can opener to improve ind with cooking tasks and provided some examples of electric can openers on [employer]. Pt verbalized understanding. OT provided education and handout regarding memory strategies for various daily tasks, such as medication management using phone timers, writing info on sticky notes, and writing appointments in a calendar. Pt verbalized understanding though stated that Notes app on phone "will confuse me." Pt benefitted from continuing education for alternative methods of documentation, including using phone microphone to dictate information. Use "WARM" strategy. W= write it down A=  associate it /R=  repeat it M=  make a mental picture  OT and pt reviewed safety strategies to decrease fall risk. With min  v/c, pt verbalized 3 safety strategies to decrease strategies, including using walker for long distances, using railings, sitting periodically for rest breaks, completing OT and PT HEP exercises to improve balance, and decreasing speed when ambulating.  Reviewed and assisted with updating Memory Binder.  Patient has HEPs in page protectors and new information added for reference at home.   PATIENT EDUCATION: Education details: Memory Strategies Person educated: Patient Education method: Explanation and Handouts Education comprehension: verbalized understanding, verbal cues required, and Family to assist in home environment also  HOME EXERCISE PROGRAM: 04/15/23: Wrist PROM stretches Access Code: [email or web address] 05/14/23: Shoulder ROM - same access code 05/20/23: Memory Strategies   GOALS: Goals reviewed with patient? Yes  SHORT TERM GOALS: Target date: 05/03/23  Patient will demonstrate ROM HEP with 25% verbal cues or less for proper execution. Baseline: No formal HEP and LUE hypertonicity Goal status: MET  2.  Patient will demonstrate LUE splint application with min assist. Baseline: Not wearing splint - missing pin to lock wrist in place Goal status: MET  3. Pt will verbalize and demonstrate understanding of ways to keep thinking skills sharp and ways to compensate for STM changes including use of Memory Binder. Baseline: not yet initiated Goal status: MET  4.  Pt will be able to reports 3+ ideas for safety recommendations to decrease fall risk Baseline: Multiple falls Goal status: MET Catch/practice my balance, exercises from PT, hold on/use walker, sit down/rest, SLOW DOWN,   LONG TERM GOALS: Target date: 05/24/23  Patient will demonstrate MI with HEP for contracture management.  Baseline: No formal HEP and LUE hypertonicity Goal status: MET  2.  Patient will demonstrate ModI with LUE splint application Baseline: Not wearing splint - missing pin to lock wrist in place Goal  status: MET  3.  Patient will be able to sort his own medication into pill box weekly with supervision Baseline: Completed by sister. Goal status: NA - family not comfortable with him performing task  4.  Patient will be able to prepare simple foods safely in the [facility] will list of 5-10 items he can prepare in his Memory binder. Baseline: Family prepares meals. Goal status: MET Pizza rolls in microwave, noodles (microwave/stove), sandwich,  ASSESSMENT:  CLINICAL IMPRESSION: Patient a carry-over of HEP and splint donning, doffing, and adjustment.  He remains motivated for therapy ie) frequently stating he can't miss but may need further visual list/cues to help with daily carryover.   Due to some distractibility, patient will benefit from family encouragement carryover of HEP and splint donning, doffing, and adjustment at home and has a Memory binder to help with organization of information.    Pt met 4/4 STG and 3/4 LTG today and pt will continue to benefit from family assistance and check-ins from family members to ensure carry over of education.   PERFORMANCE DEFICITS: in functional skills including ADLs, IADLs, tone, ROM, fascial restrictions, muscle spasms, flexibility, Fine motor control, Gross motor control, mobility, balance, decreased knowledge of precautions, decreased knowledge of use of DME, and UE functional use, cognitive skills including attention, learn, memory, problem solving, safety awareness, sequencing, temperament/personality, thought, and understand, and psychosocial skills including coping strategies, interpersonal interactions, and routines and behaviors.   IMPAIRMENTS: are limiting patient from ADLs and IADLs.   CO-MORBIDITIES: may have co-morbidities  that affects occupational performance. Patient will benefit from skilled OT to address above impairments and improve overall function.  REHAB POTENTIAL: Good   PLAN:  OCCUPATIONAL THERAPY DISCHARGE  SUMMARY  Visits from Start of Care: 5  Current functional level related to goals / functional outcomes: Pt has met goals to satisfactory levels and is pleased with outcomes.   Remaining deficits: Pt has chronic functional deficits due to history of CP but is aware of management recommendations.  Education / Equipment: Pt has all needed materials and education. Pt understands how to continue on with self-management. See tx notes for more details.   Patient agrees to discharge due to max benefits received from outpatient occupational therapy /at this time.    Victorino Sparrow, OT 05/20/2023, 12:27 PM

## 2023-05-22 ENCOUNTER — Encounter: Payer: Self-pay | Admitting: Podiatry

## 2023-05-22 ENCOUNTER — Ambulatory Visit (INDEPENDENT_AMBULATORY_CARE_PROVIDER_SITE_OTHER): Payer: Self-pay | Admitting: Podiatry

## 2023-05-22 DIAGNOSIS — B351 Tinea unguium: Secondary | ICD-10-CM

## 2023-05-22 DIAGNOSIS — Z79899 Other long term (current) drug therapy: Secondary | ICD-10-CM

## 2023-05-22 NOTE — Progress Notes (Signed)
  Subjective:  Patient ID: Roberto Manning, male    DOB: 10-Sep-1995,  MRN: [id number]  Chief Complaint  Patient presents with   Nail Problem    Nail fungus follow up    27 y.o. male returns for the above complaint.  Patient presents with thickened elongated dystrophic mycotic toenails x 10 mild pain on palpation of.  Patient would like to undergo second round of Lamisil.  The first previously did help.  Denies any other acute complaints  Objective:  There were no vitals filed for this visit. Podiatric Exam: Vascular: dorsalis pedis and posterior tibial pulses are palpable bilateral. Capillary return is immediate. Temperature gradient is WNL. Skin turgor WNL  Sensorium: Normal Semmes Weinstein monofilament test. Normal tactile sensation bilaterally. Nail Exam: Pt has thick disfigured discolored nails with subungual debris noted bilateral entire nail hallux through fifth toenails.  Pain on palpation to the nails.  Improving Ulcer Exam: There is no evidence of ulcer or pre-ulcerative changes or infection. Orthopedic Exam: Muscle tone and strength are WNL. No limitations in general ROM. No crepitus or effusions noted.  Skin: No Porokeratosis. No infection or ulcers    Assessment & Plan:   1. Long-term use of high-risk medication     Patient was evaluated and treated and all questions answered.  Onychomycosis toenails x 10.~Second round -Educated the patient on the etiology of onychomycosis and various treatment options associated with improving the fungal load.  I explained to the patient that there is 3 treatment options available to treat the onychomycosis including topical, p.o., laser treatment.  Patient elected to undergo p.o. options with Lamisil/terbinafine therapy.  In order for me to start the medication therapy, I explained to the patient the importance of evaluating the liver and obtaining the liver function test.  Once the liver function test comes back normal I will start him on  85-month course of Lamisil therapy.  Patient understood all risk and would like to proceed with Lamisil therapy.  I have asked the patient to immediately stop the Lamisil therapy if she has any reactions to it and call the office or go to the emergency room right away.  Patient states understanding    No follow-ups on file.

## 2023-05-24 ENCOUNTER — Ambulatory Visit: Payer: MEDICAID | Attending: Physical Medicine and Rehabilitation | Admitting: Physical Therapy

## 2023-05-24 DIAGNOSIS — R2681 Unsteadiness on feet: Secondary | ICD-10-CM | POA: Insufficient documentation

## 2023-05-24 DIAGNOSIS — M5459 Other low back pain: Secondary | ICD-10-CM | POA: Insufficient documentation

## 2023-05-24 DIAGNOSIS — M79605 Pain in left leg: Secondary | ICD-10-CM | POA: Insufficient documentation

## 2023-05-24 DIAGNOSIS — M6281 Muscle weakness (generalized): Secondary | ICD-10-CM | POA: Insufficient documentation

## 2023-05-24 DIAGNOSIS — R2689 Other abnormalities of gait and mobility: Secondary | ICD-10-CM | POA: Diagnosis present

## 2023-05-24 NOTE — Therapy (Signed)
OUTPATIENT PHYSICAL THERAPY NEURO TREATMENT - DISCHARGE NOTE   Patient Name: Roberto Manning MRN: [id number] DOB:1996/03/07, 27 y.o., male Today's Date: 05/24/2023   PCP: Benetta Spar, MD REFERRING PROVIDER: Merrily Brittle, DO   PHYSICAL THERAPY DISCHARGE SUMMARY  Visits from Start of Care: 8  Current functional level related to goals / functional outcomes: Mod I   Remaining deficits: Decreased balance, LE strength, LE ROM, and ongoing pain in context of CP diagnosis   Education / Equipment: Handout for HEP and where to purchase Lofstrand crutch   Patient agrees to discharge. Patient goals were partially met. Patient is being discharged due to being pleased with the current functional level.   END OF SESSION:  PT End of Session - 05/24/23 0805     Visit Number 8    Number of Visits 10   recert   Date for PT Re-Evaluation 06/21/23   recert, to allow for scheduling delays   Authorization Type [employer]    Progress Note Due on Visit 10    PT Start Time 0805   pt arrived late   PT Stop Time 0820   D/C   PT Time Calculation (min) 15 min    Equipment Utilized During Treatment Gait belt    Activity Tolerance Patient tolerated treatment well    Behavior During Therapy WFL for tasks assessed/performed;Impulsive                   Past Medical History:  Diagnosis Date   ADHD    Cerebral palsy (HCC)    Seizures (HCC)    Past Surgical History:  Procedure Laterality Date   HIP PINNING     Patient Active Problem List   Diagnosis Date Noted   ADHD 04/20/2020   Seizures (HCC) 06/06/2017   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   Neutropenia (HCC) 04/28/2016   Cerebral palsy (HCC) 04/19/2016   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   Seizure disorder (HCC) 07/06/2012   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 Pt reports no pain at this time, L foot does continue to bother him at times. Pt understanding that he needs to reach out to orthotist to adjust his L AFO and still needs to purchase a Lofstrand crutch.  Pt accompanied by: self   PERTINENT HISTORY: Seizure disorder, ADHD, CP  PAIN:  Are you having pain? Yes: NPRS scale: 5/10 Pain location: L leg and back Pain description: sharp Aggravating factors: when its about to rain, when I get too cold Relieving factors: nothing (has tried OTC meds and mm relaxers)  PRECAUTIONS: Fall and Other: seizures (last seizure was Jan 2024)  WEIGHT BEARING RESTRICTIONS: No  FALLS: Has patient fallen in last 6 months? Yes. Number of falls falls at least once per day to his L side (overheats and falls)  LIVING ENVIRONMENT: Lives with: lives with their family Lives in: House/apartment Stairs: Yes: Internal: 12 steps; on right going up Has following equipment at home: Walker - 2 wheeled  PLOF: Independent with gait and Independent with transfers  PATIENT GOALS: "trying to get myself better" "get my leg strength back so I can get back active"  OBJECTIVE:   DIAGNOSTIC FINDINGS:  Thoracic Spine Xray 01/21/23 FINDINGS: Lower thoracic scoliosis is apex to the left, centered at  approximately T9. On the AP film, the pedicles are intact. No hemivertebra or segmentation anomaly is identified. The upper thoracic spine is obscured on the lateral and swimmer's views due to overlying skeletal and soft tissue structures. Visualized thoracic vertebral body heights and anteroposterior alignment are maintained. No definite fractures are visualized. Spinous processes in the lower thoracic spine are obscured by overlying ribs.  IMPRESSION:  1. Similar lower thoracic scoliosis.  2. No acute fractures are visualized, with limitations as above.  COGNITION: Overall cognitive status: History of cognitive impairments - at baseline   SENSATION: Occasional N/T in LE Light touch WFL  MUSCLE TONE: clonus in BLE   POSTURE: rounded shoulders, forward head, posterior pelvic tilt, and flexed trunk   LOWER EXTREMITY ROM:     Passive  Right eval Left eval  Hip flexion    Hip extension    Hip abduction    Hip adduction    Hip internal rotation    Hip external rotation    Knee flexion    Knee extension  -10 degrees  Ankle dorsiflexion    Ankle plantarflexion    Ankle inversion    Ankle eversion     (Blank rows = not tested)     LOWER EXTREMITY MMT:    MMT Right Eval Left Eval  Hip flexion 5 4  Hip extension    Hip abduction    Hip adduction    Hip internal rotation    Hip external rotation    Knee flexion 5 3  Knee extension 4 3  Ankle dorsiflexion 5 3  Ankle plantarflexion    Ankle inversion    Ankle eversion    (Blank rows = not tested)   PATIENT SURVEYS: Initial Eval: Modified Oswestry 29/50  LEFS 30/80   TODAY'S TREATMENT:    TherAct For LTG assessment: Modified Oswestry: 26/50 LEFS: 16/80 5xSTS: 16.97 sec no UE  PATIENT EDUCATION: Education details: continue HEP, purchase Lofstrand crutch, reach out to orthotist for custom L AFO, plan to d/c from PT  today Person educated: Patient Education method: Explanation Education comprehension: verbalized understanding  HOME EXERCISE PROGRAM: Access Code: [email or web address] URL: [email or web address] Date: 03/15/2023 Prepared by: Peter Congo  Exercises - Long Sitting Hamstring Stretch  - 1 x daily - 7 x weekly - 1 sets - 3-5 reps - Hooklying Single Knee to Chest Stretch with Towel  - 1 x daily - 7 x weekly - 1 sets - 3-5 reps - Sit to Stand with Resistance Around Legs  - 1 x daily - 7 x weekly - 3 sets - 10 reps - Supine Bridge with Resistance Band  - 1 x daily - 7 x weekly - 3 sets - 10 reps - Seated Piriformis Stretch  - 1 x daily - 7 x weekly - 1 sets - 3-5 reps - 30 sec hold - Supine Lower Trunk Rotation  - 1 x daily - 7 x weekly - 3 sets - 10 reps   GOALS: Goals reviewed with patient? Yes   NEW SHORT TERM GOALS=LONG TERM GOALS due to length of POC   NEW LONG TERM GOALS:  Target date: 06/07/2023  Pt will be independent with final HEP for improved strength, balance, transfers and gait. Baseline: established but will continue to be added to during POC Goal status: MET  2.  Pt will improve his score to </= 25/50 on the Oswestry to demonstrate decreased disability level Baseline: 29/50 (7/5), 28/50 (9/6), 26/50 (10/4) Goal status: NOT MET  3.  Pt will improve his score on the LEFS to 35/80 to demonstrate decreased disability level Baseline: 30/80 (7/5), 14/80 (9/6), 16/80 (10/4) Goal status: NOT MET  4.  Pt will improve 5 x STS to less than or equal to 15 seconds to demonstrate improved functional strength and transfer efficiency.  Baseline: 20 sec (7/5), 19.18 sec (8/23), 16.97 sec no UE (10/4) Goal status: NOT MET    ASSESSMENT:  CLINICAL IMPRESSION: Emphasis of skilled PT session on assessing LTG in preparation for d/c from OPPT services this date. Pt has met 1/4 LTG due to being independent with his final HEP. He did make progress towards improved management of  his back pain symptoms as well as improved his functional BLE strength, however he did not exhibit enough improvement to meet goals set. Pt does continue to have a fluctuation in his pain symptoms due to chronic nature of his CP. Pt to continue independently with his HEP at home and could benefit from a return to PT services in the future to reassess his needs.   OBJECTIVE IMPAIRMENTS: Abnormal gait, decreased balance, decreased cognition, decreased coordination, decreased knowledge of use of DME, difficulty walking, decreased ROM, decreased strength, increased muscle spasms, impaired flexibility, impaired sensation, impaired tone, impaired UE functional use, improper body mechanics, postural dysfunction, and pain.   ACTIVITY LIMITATIONS: carrying, lifting, bending, standing, squatting, stairs, transfers, bathing, and dressing  PARTICIPATION LIMITATIONS: cleaning, interpersonal relationship, and community activity  PERSONAL FACTORS: Time since onset of injury/illness/exacerbation and 1-2 comorbidities: seizure disorder, ADHD, and CP  are also affecting patient's functional outcome.   REHAB POTENTIAL: Fair chronic history of CP  CLINICAL DECISION MAKING: Stable/uncomplicated  EVALUATION COMPLEXITY: Low   Check all possible CPT codes: 78295 - PT Re-evaluation, 97110- Therapeutic Exercise, [phone]- Neuro Re-education, [phone] - Gait Training, [phone] - Manual Therapy, 97530 - Therapeutic Activities, 97535 - Self Care, [phone] - Electrical stimulation (  Manual), 21308 - Orthotic Fit, and [id number] - Aquatic therapy    Check all conditions that are expected to impact treatment: {Conditions expected to impact treatment:Contractures, spasticity or fracture relevant to requested treatment, Neurological condition and/or seizures, and Presence of Medical Equipment   If treatment provided at initial evaluation, no treatment charged due to lack of authorization.        Peter Congo, PT, DPT, CSRS   05/24/2023,  8:21 AM

## 2023-05-25 LAB — HEPATIC FUNCTION PANEL
AG Ratio: 1.6 (calc) (ref 1.0–2.5)
ALT: 17 U/L (ref 9–46)
AST: 21 U/L (ref 10–40)
Albumin: 4.8 g/dL (ref 3.6–5.1)
Alkaline phosphatase (APISO): 90 U/L (ref 36–130)
Bilirubin, Direct: 0.1 mg/dL (ref 0.0–0.2)
Globulin: 3 g/dL (ref 1.9–3.7)
Indirect Bilirubin: 0.3 mg/dL (ref 0.2–1.2)
Total Bilirubin: 0.4 mg/dL (ref 0.2–1.2)
Total Protein: 7.8 g/dL (ref 6.1–8.1)

## 2023-05-27 MED ORDER — OXYCODONE-ACETAMINOPHEN 5-325 MG PO TABS: 1.0000 | ORAL_TABLET | ORAL | 0 refills | Status: AC | PRN

## 2023-07-17 ENCOUNTER — Encounter (HOSPITAL_COMMUNITY): Payer: Self-pay | Admitting: *Deleted

## 2023-07-17 ENCOUNTER — Emergency Department (HOSPITAL_COMMUNITY)
Admission: EM | Admit: 2023-07-17 | Discharge: 2023-07-17 | Disposition: A | Discharge status: Home / Self Care | Payer: MEDICAID | Attending: Emergency Medicine | Admitting: Emergency Medicine

## 2023-07-17 ENCOUNTER — Other Ambulatory Visit: Payer: Self-pay

## 2023-07-17 DIAGNOSIS — G40909 Epilepsy, unspecified, not intractable, without status epilepticus: Secondary | ICD-10-CM | POA: Diagnosis not present

## 2023-07-17 DIAGNOSIS — R569 Unspecified convulsions: Secondary | ICD-10-CM

## 2023-07-17 LAB — CBC WITH DIFFERENTIAL/PLATELET
Abs Immature Granulocytes: 0.03 10*3/uL (ref 0.00–0.07)
Basophils Absolute: 0 10*3/uL (ref 0.0–0.1)
Basophils Relative: 0 %
Eosinophils Absolute: 0.1 10*3/uL (ref 0.0–0.5)
Eosinophils Relative: 2 %
HCT: 44.7 % (ref 39.0–52.0)
Hemoglobin: 14.7 g/dL (ref 13.0–17.0)
Immature Granulocytes: 0 %
Lymphocytes Relative: 26 %
Lymphs Abs: 1.8 10*3/uL (ref 0.7–4.0)
MCH: 32.5 pg (ref 26.0–34.0)
MCHC: 32.9 g/dL (ref 30.0–36.0)
MCV: 98.7 fL (ref 80.0–100.0)
Monocytes Absolute: 0.8 10*3/uL (ref 0.1–1.0)
Monocytes Relative: 12 %
Neutro Abs: 4 10*3/uL (ref 1.7–7.7)
Neutrophils Relative %: 60 %
Platelets: 180 10*3/uL (ref 150–400)
RBC: 4.53 MIL/uL (ref 4.22–5.81)
RDW: 14 % (ref 11.5–15.5)
WBC: 6.7 10*3/uL (ref 4.0–10.5)
nRBC: 0 % (ref 0.0–0.2)

## 2023-07-17 LAB — CARBAMAZEPINE LEVEL, TOTAL: Carbamazepine Lvl: <2 ug/mL — ABNORMAL LOW (ref 4.0–12.0)

## 2023-07-17 LAB — COMPREHENSIVE METABOLIC PANEL
ALT: 19 U/L (ref 0–44)
AST: 30 U/L (ref 15–41)
Albumin: 4.3 g/dL (ref 3.5–5.0)
Alkaline Phosphatase: 75 U/L (ref 38–126)
Anion gap: 13 (ref 5–15)
BUN: 15 mg/dL (ref 6–20)
CO2: 19 mmol/L — ABNORMAL LOW (ref 22–32)
Calcium: 9.3 mg/dL (ref 8.9–10.3)
Chloride: 105 mmol/L (ref 98–111)
Creatinine, Ser: 0.91 mg/dL (ref 0.61–1.24)
GFR, Estimated: >60 mL/min (ref >60)
Glucose, Bld: 144 mg/dL — ABNORMAL HIGH (ref 70–99)
Potassium: 3.8 mmol/L (ref 3.5–5.1)
Sodium: 137 mmol/L (ref 135–145)
Total Bilirubin: 0.5 mg/dL (ref <1.2)
Total Protein: 7.9 g/dL (ref 6.5–8.1)

## 2023-07-17 LAB — CBG MONITORING, ED: Glucose-Capillary: 142 mg/dL — ABNORMAL HIGH (ref 70–99)

## 2023-07-17 LAB — LIPASE, BLOOD: Lipase: 35 U/L (ref 11–51)

## 2023-07-17 MED ORDER — ONDANSETRON 4 MG PO TBDP
ORAL_TABLET | ORAL | Status: AC
  Administered 2023-07-17: 4 mg
  Filled 2023-07-17: qty 1

## 2023-07-17 MED ORDER — ONDANSETRON 4 MG PO TBDP
4.0000 mg | ORAL_TABLET | Freq: Once | ORAL | Status: AC | PRN
  Administered 2023-07-17: 4 mg via ORAL

## 2023-07-17 MED ORDER — ONDANSETRON HCL 4 MG/2ML IJ SOLN
4.0000 mg | Freq: Once | INTRAMUSCULAR | Status: AC
  Administered 2023-07-17: 4 mg via INTRAVENOUS
  Filled 2023-07-17: qty 2

## 2023-07-17 NOTE — ED Triage Notes (Addendum)
BIB EMS pt had a seizure, missed one dose of meds last night. This is first seizure this year reported by mom, mom also states pts seized for 15 minutes. Post ictal upon EMS arrival. Now A& O. 130/84-CBG 115-96-98%RA-16  Pt nauseated and dry heaving in room.

## 2023-07-17 NOTE — ED Provider Notes (Signed)
[place] EMERGENCY DEPARTMENT AT [facility] Provider Note   CSN: [id number] Arrival date & time: 07/17/23  1219     History  Chief Complaint  Patient presents with   Seizures    Roberto Manning is a 27 y.o. male.  27 year old male with history of seizure disorder presents after having witnessed seizure at home.  According to EMS, seizure lasted approximately 15 minutes.  Was initially postictal.  No bowel or bladder dysfunction with this.  Patient states he missed his dose of Tegretol.  Is unsure of how many days this is occurred for.  Currently feels back to his baseline.  EMS was called and CBG was 115.  Patient transferred here for further evaluation       Home Medications Prior to Admission medications   Medication Sig Start Date End Date Taking? Authorizing Provider  baclofen (LIORESAL) 10 MG tablet Take 10 mg by mouth 2 (two) times daily.    [provider]  carbamazepine (TEGRETOL XR) 200 MG 12 hr tablet Take 200 mg by mouth 2 (two) times daily. Patient not taking: Reported on 02/22/2023    [provider]  DENTA 5000 PLUS 1.1 % CREA dental cream Take by mouth.    [provider]  Fluocinolone Acetonide Scalp 0.01 % OIL Apply to scalp daily as needed for itch/flaking. Avoid face. Patient not taking: Reported on 02/22/2023 10/22/19   [provider]  ibuprofen (ADVIL) 800 MG tablet Take 800 mg by mouth 2 (two) times daily. 01/21/23   [provider]  oxcarbazepine (TRILEPTAL) 600 MG tablet Take 1 tablet by mouth 2 (two) times daily. 01/30/22   [provider]  oxyCODONE-acetaminophen (PERCOCET) 5-325 MG tablet Take 1 tablet by mouth every 4 (four) hours as needed for severe pain. 05/27/23   Candelaria Stagers, DPM  terbinafine (LAMISIL) 250 MG tablet Take 1 tablet (250 mg total) by mouth daily. 01/22/23   Candelaria Stagers, DPM      Allergies    Patient has no known allergies.    Review of Systems   Review of Systems   All other systems reviewed and are negative.   Physical Exam Updated Vital Signs BP 117/68 (BP Location: Left Arm)   Pulse 84   Temp 97.7 F (36.5 C) (Oral)   Resp 16   SpO2 95%  Physical Exam Vitals and nursing note reviewed.  Constitutional:      General: He is not in acute distress.    Appearance: Normal appearance. He is well-developed. He is not toxic-appearing.  HENT:     Head: Normocephalic and atraumatic.  Eyes:     General: Lids are normal.     Conjunctiva/sclera: Conjunctivae normal.     Pupils: Pupils are equal, round, and reactive to light.  Neck:     Thyroid: No thyroid mass.     Trachea: No tracheal deviation.  Cardiovascular:     Rate and Rhythm: Normal rate and regular rhythm.     Heart sounds: Normal heart sounds. No murmur heard.    No gallop.  Pulmonary:     Effort: Pulmonary effort is normal. No respiratory distress.     Breath sounds: Normal breath sounds. No stridor. No decreased breath sounds, wheezing, rhonchi or rales.  Abdominal:     General: There is no distension.     Palpations: Abdomen is soft.     Tenderness: There is no abdominal tenderness. There is no rebound.  Musculoskeletal:  General: No tenderness. Normal range of motion.     Cervical back: Normal range of motion and neck supple.  Skin:    General: Skin is warm and dry.     Findings: No abrasion or rash.  Neurological:     Mental Status: He is alert and oriented to person, place, and time. Mental status is at baseline.     GCS: GCS eye subscore is 4. GCS verbal subscore is 5. GCS motor subscore is 6.     Cranial Nerves: Cranial nerves are intact. No cranial nerve deficit.     Sensory: No sensory deficit.     Motor: Motor function is intact.  Psychiatric:        Attention and Perception: Attention normal.        Speech: Speech normal.        Behavior: Behavior normal.     ED Results / Procedures / Treatments   Labs (all labs ordered are listed, but only abnormal  results are displayed) Labs Reviewed  COMPREHENSIVE METABOLIC PANEL  CBC WITH DIFFERENTIAL/PLATELET  CARBAMAZEPINE LEVEL, TOTAL    EKG EKG Interpretation Date/Time:  Wednesday July 17 2023 12:30:41 EST Ventricular Rate:  83 PR Interval:  158 QRS Duration:  88 QT Interval:  367 QTC Calculation: 432 R Axis:   -28  Text Interpretation: Sinus rhythm Probable left atrial enlargement Borderline left axis deviation Confirmed by Lorre Nick ([id number]) on 07/17/2023 12:31:47 PM  Radiology No results found.  Procedures Procedures    Medications Ordered in ED Medications - No data to display  ED Course/ Medical Decision Making/ A&P                                 Medical Decision Making Amount and/or Complexity of Data Reviewed Labs: ordered.  Risk Prescription drug management.   Patient is EKG from interpretation shows sinus rhythm.  Laboratory studies here is stable.  His Tegretol level is 0.  Discussed with Dr. Jerrell Belfast from neurology.  Recommends patient take his Trileptal as directed follow-up with his neurologist.  Mom at bedside and she will arrange follow-up        Final Clinical Impression(s) / ED Diagnoses Final diagnoses:  None    Rx / DC Orders ED Discharge Orders     None         Lorre Nick, MD 07/17/23 1453

## 2023-07-18 ENCOUNTER — Encounter (HOSPITAL_COMMUNITY): Payer: Self-pay

## 2023-07-18 ENCOUNTER — Emergency Department (HOSPITAL_COMMUNITY): Payer: MEDICAID

## 2023-07-18 ENCOUNTER — Emergency Department (HOSPITAL_COMMUNITY)
Admission: EM | Admit: 2023-07-18 | Discharge: 2023-07-18 | Disposition: A | Discharge status: Home / Self Care | Payer: MEDICAID | Attending: Emergency Medicine | Admitting: Emergency Medicine

## 2023-07-18 ENCOUNTER — Other Ambulatory Visit: Payer: Self-pay

## 2023-07-18 DIAGNOSIS — Z1152 Encounter for screening for COVID-19: Secondary | ICD-10-CM | POA: Insufficient documentation

## 2023-07-18 DIAGNOSIS — R569 Unspecified convulsions: Secondary | ICD-10-CM | POA: Insufficient documentation

## 2023-07-18 LAB — URINALYSIS, ROUTINE W REFLEX MICROSCOPIC
Bilirubin Urine: NEGATIVE
Glucose, UA: NEGATIVE mg/dL
Ketones, ur: 5 mg/dL — AB
Leukocytes,Ua: NEGATIVE
Nitrite: NEGATIVE
Protein, ur: 30 mg/dL — AB
Specific Gravity, Urine: 1.017 (ref 1.005–1.030)
pH: 5 (ref 5.0–8.0)

## 2023-07-18 LAB — CK: Total CK: 824 U/L — ABNORMAL HIGH (ref 49–397)

## 2023-07-18 LAB — RAPID URINE DRUG SCREEN, HOSP PERFORMED
Amphetamines: NOT DETECTED
Barbiturates: NOT DETECTED
Benzodiazepines: NOT DETECTED
Cocaine: NOT DETECTED
Opiates: NOT DETECTED
Tetrahydrocannabinol: NOT DETECTED

## 2023-07-18 LAB — MAGNESIUM: Magnesium: 2.1 mg/dL (ref 1.7–2.4)

## 2023-07-18 LAB — RESP PANEL BY RT-PCR (RSV, FLU A&B, COVID)  RVPGX2
Influenza A by PCR: NEGATIVE
Influenza B by PCR: NEGATIVE
Resp Syncytial Virus by PCR: NEGATIVE
SARS Coronavirus 2 by RT PCR: NEGATIVE

## 2023-07-18 MED ORDER — ONDANSETRON 4 MG PO TBDP: 4.0000 mg | ORAL_TABLET | Freq: Three times a day (TID) | ORAL | 0 refills | Status: AC | PRN

## 2023-07-18 MED ORDER — ONDANSETRON 4 MG PO TBDP: 4.0000 mg | ORAL_TABLET | Freq: Three times a day (TID) | ORAL | 0 refills | Status: DC | PRN

## 2023-07-18 MED ORDER — SODIUM CHLORIDE 0.9 % IV BOLUS
1000.0000 mL | Freq: Once | INTRAVENOUS | Status: AC
  Administered 2023-07-18: 1000 mL via INTRAVENOUS

## 2023-07-18 MED ORDER — LEVETIRACETAM 500 MG PO TABS: 500.0000 mg | ORAL_TABLET | Freq: Two times a day (BID) | ORAL | 0 refills | Status: AC

## 2023-07-18 MED ORDER — ACETAMINOPHEN 325 MG PO TABS
650.0000 mg | ORAL_TABLET | Freq: Once | ORAL | Status: AC
  Administered 2023-07-18: 650 mg via ORAL
  Filled 2023-07-18: qty 2

## 2023-07-18 MED ORDER — LEVETIRACETAM 500 MG PO TABS: 500.0000 mg | ORAL_TABLET | Freq: Two times a day (BID) | ORAL | 0 refills | Status: DC

## 2023-07-18 MED ORDER — LEVETIRACETAM IN NACL 1000 MG/100ML IV SOLN
1000.0000 mg | Freq: Once | INTRAVENOUS | Status: AC
  Administered 2023-07-18: 1000 mg via INTRAVENOUS
  Filled 2023-07-18: qty 100

## 2023-07-18 NOTE — Discharge Instructions (Addendum)
You were seen in ER today for seizures.  Your workup is reassuring.  You have been initiated on an additional seizure medication called Keppra which you should take twice per day in addition to your Trileptal.  Please follow-up closely with your neurologist; call their office tomorrow to schedule follow-up appointment.  Return to the ER with any new severe symptoms.  Additionally you may use the prescribed nausea medication as needed.

## 2023-07-18 NOTE — Plan of Care (Signed)
  I am asked to recommend any changes to seizure medications for this patient presenting with breakthrough seizures without clear triggering factor, patient's mom reports she monitors his medication per ED provider and he has not missed any doses  Baseline seizure frequency is very rare, none for 2 years.  He was initially postictal but now back at his baseline Head CT was obtained which is negative for acute process on my read but full radiology read pending  He is currently on oxcarbazepine; carbamazepine level was checked and undetectable Chest x-ray formal read pending, clear per ED provider  Recommendations: -UA to finish infectious workup -CK to assess for potential rhabdomyolysis in the setting of multiple GTC's -magnesium level to assess for other provoking factors -Add Keppra 500 twice daily -Continue oxcarbazepine at current home dosing at this time -Oxcarbazepine level (10-hydroxycarbazepine) to be drawn now to assess room for medication adjustment on an outpatient basis -Seizure precautions -Close outpatient follow-up with neurologist if head CT report results negative  Notes from outpatient neurologist reviewed: Longitudinal History (from Dr. Tawni Pummel who was is previous provider): Patient with cerebral palsy presented in November 2013 with his first seizure (GTC). EEG at that time showed no epileptiform activity, and he was not started on antiseizure medication due to single event. The patient then had second event in the next few months and was started on Zonegran. He was then transitioned from Zonegran to Tegretol in July 2014 due to recurrent seizures. He initially experienced good seizure control since initiation of Tegretol, however had sedation side effect on tegretol so was transitioned to trileptal. He continued to have seizure like events in 2018 (some due to missing medications). Given that he had ongoing seizure like events during which time patient reported  compliance with medication (although did have some below therapeutic levels), EMU admission was scheduled for spell capture. No spells were captured but only left hemispheric slowing was observed.   Following EMU admission, he reported at follow up that every day he had been having falls, with altered consciousness during spell (unclear if change in consciousness occurs prior to fall). Falls occurring after walking for minutes. There may be a mild shaking of bilateral legs following the fall. He also thinks his frequency of falls prior to AED change was the same. He returned to the ED with breakthrough spells and medication was increased by ED to 300mg  TID. When he was seen in follow up, he reported difficulty with TID dosing and given concern that patient may be missing AED dosages (low levels on multiple occasions made switch to 600mg  BID IR.   Today [06/28/2023], patient is accompanied by his mom. He has been compliant with his medications. No seizures in last 2 years since his last visit. He reports frequent falls due to his "legs giving out". He had 6 weeks of physical therapy that just ended in October. He denies any dizziness or loss of consciousness. He does wear an AFO in his left lower extremity. He is currently seeing PM&R at [facility]. He is currently on baclofen for his back pain. PT recommended using cane but he has not been using it. He has a walker at home but does not use it. No other questions or concerns today.

## 2023-07-18 NOTE — ED Notes (Signed)
Mother at bedside.

## 2023-07-18 NOTE — ED Triage Notes (Signed)
Pt has had 3 seizures in last 24 hours with last one occurring 30 mins ago and described by mother as grand mal and lasting 5 minutes. Pt has history of epilepsy and cerebral palsy. Pt ws also seen at [facility] earlier for same. EMS reports pt was post-ictal upon their arrival but is now a&o x 4 per baseline

## 2023-07-18 NOTE — ED Provider Notes (Signed)
[place] EMERGENCY DEPARTMENT AT [facility] Provider Note   CSN: [id number] Arrival date & time: 07/18/23  0215     History  Chief Complaint  Patient presents with   Seizures    Roberto Manning is a 27 y.o. male who has history of cerebral palsy and seizure disorder on Trileptal, who presents with mother bedside with third breakthrough seizure today.  Patient is also been nauseous and vomiting.  Has not missed any doses of his Trileptal earlier in the day.  Was seen previously at [facility] but it appears there is miscommunication between family and ED provider at that time.  Seizure was attributed to subtherapeutic level of carbamazepine.  Patient is not on carbamazepine for several months.  He is only on Trileptal at this time.  States he has not missed any doses this except for potentially doses he vomited up today following his ED visit.  Per mother at the bedside patient had witnessed generalized tonic-clonic type seizure x 7 minutes.  No abortive medications were administered as the family did not have any at home.  Postictal at time of arrival to the ED, however quickly returning to baseline with GCS of 15 at time my arrival to the bedside.  Nonfocal neurologic exam.  At baseline patient with left upper extremity spasticity, able to perform ADLs.  HPI     Home Medications Prior to Admission medications   Medication Sig Start Date End Date Taking? Authorizing Provider  baclofen (LIORESAL) 10 MG tablet Take 10 mg by mouth 2 (two) times daily.    [provider]  carbamazepine (TEGRETOL XR) 200 MG 12 hr tablet Take 200 mg by mouth 2 (two) times daily. Patient not taking: Reported on 02/22/2023    [provider]  DENTA 5000 PLUS 1.1 % CREA dental cream Take by mouth.    [provider]  Fluocinolone Acetonide Scalp 0.01 % OIL Apply to scalp daily as needed for itch/flaking. Avoid face. Patient not taking: Reported on 02/22/2023 10/22/19    [provider]  ibuprofen (ADVIL) 800 MG tablet Take 800 mg by mouth 2 (two) times daily. 01/21/23   [provider]  levETIRAcetam (KEPPRA) 500 MG tablet Take 1 tablet (500 mg total) by mouth 2 (two) times daily. 07/18/23 08/17/23  Linzy Laury, Lupe Carney R, PA-C  ondansetron (ZOFRAN-ODT) 4 MG disintegrating tablet Take 1 tablet (4 mg total) by mouth every 8 (eight) hours as needed for nausea or vomiting. 07/18/23   Kaesyn Johnston, Eugene Gavia, PA-C  oxcarbazepine (TRILEPTAL) 600 MG tablet Take 1 tablet by mouth 2 (two) times daily. 01/30/22   [provider]  oxyCODONE-acetaminophen (PERCOCET) 5-325 MG tablet Take 1 tablet by mouth every 4 (four) hours as needed for severe pain. 05/27/23   Candelaria Stagers, DPM  terbinafine (LAMISIL) 250 MG tablet Take 1 tablet (250 mg total) by mouth daily. 01/22/23   Candelaria Stagers, DPM      Allergies    Patient has no known allergies.    Review of Systems   Review of Systems  Neurological:  Positive for seizures.    Physical Exam Updated Vital Signs BP 126/75   Pulse 88   Temp 97.6 F (36.4 C) (Oral)   Resp 14   SpO2 100%  Physical Exam Vitals and nursing note reviewed.  Constitutional:      Appearance: He is not ill-appearing or toxic-appearing.  HENT:     Head: Normocephalic and atraumatic.     Mouth/Throat:  Mouth: Mucous membranes are moist.     Pharynx: No oropharyngeal exudate or posterior oropharyngeal erythema.  Eyes:     General:        Right eye: No discharge.        Left eye: No discharge.     Conjunctiva/sclera: Conjunctivae normal.  Cardiovascular:     Rate and Rhythm: Normal rate and regular rhythm.     Pulses: Normal pulses.     Heart sounds: Normal heart sounds. No murmur heard. Pulmonary:     Effort: Pulmonary effort is normal. No respiratory distress.     Breath sounds: Normal breath sounds. No wheezing or rales.  Abdominal:     General: Bowel sounds are normal. There is no distension.      Palpations: Abdomen is soft.     Tenderness: There is no abdominal tenderness. There is no guarding or rebound.  Musculoskeletal:        General: No deformity.     Cervical back: Neck supple.     Right lower leg: No edema.     Left lower leg: No edema.  Skin:    General: Skin is warm and dry.     Capillary Refill: Capillary refill takes less than 2 seconds.  Neurological:     General: No focal deficit present.     Mental Status: He is alert. Mental status is at baseline.  Psychiatric:        Mood and Affect: Mood normal.     ED Results / Procedures / Treatments   Labs (all labs ordered are listed, but only abnormal results are displayed) Labs Reviewed  URINALYSIS, ROUTINE W REFLEX MICROSCOPIC - Abnormal; Notable for the following components:      Result Value   APPearance HAZY (*)    Hgb urine dipstick SMALL (*)    Ketones, ur 5 (*)    Protein, ur 30 (*)    Bacteria, UA RARE (*)    All other components within normal limits  CK - Abnormal; Notable for the following components:   Total CK 824 (*)    All other components within normal limits  RESP PANEL BY RT-PCR (RSV, FLU A&B, COVID)  RVPGX2  RAPID URINE DRUG SCREEN, HOSP PERFORMED  MAGNESIUM  10-HYDROXYCARBAZEPINE    EKG EKG Interpretation Date/Time:  Thursday July 18 2023 02:24:31 EST Ventricular Rate:  92 PR Interval:  143 QRS Duration:  94 QT Interval:  351 QTC Calculation: 435 R Axis:   -15  Text Interpretation: Sinus rhythm LVH by voltage ST elev, probable normal early repol pattern No significant change was found Confirmed by Glynn Octave [phone]) on 07/18/2023 3:40:44 AM  Radiology DG Chest Portable 1 View  Result Date: 07/18/2023 CLINICAL DATA:  27 year old male with history of aspiration. Possible seizures. EXAM: PORTABLE CHEST 1 VIEW COMPARISON:  No priors. FINDINGS: Lung volumes are normal. No consolidative airspace disease. No pleural effusions. No pneumothorax. No pulmonary nodule or mass  noted. Pulmonary vasculature and the cardiomediastinal silhouette are within normal limits. IMPRESSION: No radiographic evidence of acute cardiopulmonary disease. Electronically Signed   By: Trudie Reed M.D.   On: 07/18/2023 05:04   CT HEAD WO CONTRAST ( )  Result Date: 07/18/2023 CLINICAL DATA:  History of epilepsy with 3 seizures today. EXAM: CT HEAD WITHOUT CONTRAST TECHNIQUE: Contiguous axial images were obtained from the base of the skull through the vertex without intravenous contrast. RADIATION DOSE REDUCTION: This exam was performed according to the departmental dose-optimization program which includes automated exposure  control, adjustment of the mA and/or kV according to patient size and/or use of iterative reconstruction technique. COMPARISON:  05/05/2018 FINDINGS: Brain: No evidence of acute infarction, hemorrhage, hydrocephalus, extra-axial collection or mass lesion/mass effect. Vascular: No hyperdense vessel or unexpected calcification. Skull: Normal. Negative for fracture or focal lesion. Sinuses/Orbits: No acute finding. IMPRESSION: Stable and negative head CT. Electronically Signed   By: Tiburcio Pea M.D.   On: 07/18/2023 04:31    Procedures Procedures    Medications Ordered in ED Medications  acetaminophen (TYLENOL) tablet 650 mg (has no administration in time range)  levETIRAcetam (KEPPRA) IVPB 1000 mg/100 mL premix (0 mg Intravenous Stopped 07/18/23 0356)  sodium chloride 0.9 % bolus 1,000 mL (0 mLs Intravenous Stopped 07/18/23 0631)    ED Course/ Medical Decision Making/ A&P Clinical Course as of 07/18/23 0633  Thu Jul 18, 2023  0406 Keppra 500 BID. [RS]    Clinical Course User Index [RS] Sherrilee Gilles                                 Medical Decision Making 27 year old male with history of seizures and cerebral palsy who presents with 3 breakthrough seizures today, no seizures prior to today and all of 2024.  Normal vitals on intake.   Cardiopulmonary is unremarkable, abdominal exam is benign.  Nonfocal neurologic exam.  Patient somewhat somnolent upon arrival but quickly returned to his mental status baseline, no longer postictal.  Amount and/or Complexity of Data Reviewed Labs: ordered.    Details: CBC from this afternoon unremarkable, CMP unremarkable as well and lipase was normal.  UA with scant hemoglobin ketonuria proteinuria but no findings concerning for infection.  Keppra loading dose administered in the ED. CK mildly elevated to 824, fluids administered, under 1000 and with normal renal function earlier in the day, do not feel this warrants admission.  UDS negative and neck normal.   Radiology: ordered.    Details:  Chest x-ray negative for acute cardiopulmonary disease.  CT head unremarkable.   ECG/medicine tests:     Details: EKG with sinus rhythm as above.    Risk OTC drugs. Prescription drug management.    Case discussed with neurologist as above.  Plan will be for discharge home with close outpatient neurology follow-up.  Will initiate Keppra 500 twice daily in addition to patient's Trileptal dosage, will also provide Zofran prescription at home for nausea vomiting.  Suspicion for possible viral illness causing GI symptomatology contributing to patient's presentation today.   Clinical concern for emergent underlying etiology or pending agreeable inpatient management at this time is exceedingly low.  Fiona voiced understanding of his medical evaluation and treatment plan. Each of their questions answered to their expressed satisfaction.  Return precautions were given.  Patient is well-appearing, stable, and was discharged in good condition.  This chart was dictated using voice recognition software, Dragon. Despite the best efforts of this provider to proofread and correct errors, errors may still occur which can change documentation meaning.          Final Clinical Impression(s) / ED Diagnoses Final  diagnoses:  Seizure (HCC)    Rx / DC Orders ED Discharge Orders          Ordered    levETIRAcetam (KEPPRA) 500 MG tablet  2 times daily,   Status:  Discontinued        07/18/23 0620    ondansetron (ZOFRAN-ODT) 4 MG disintegrating  tablet  Every 8 hours PRN,   Status:  Discontinued        07/18/23 0620    ondansetron (ZOFRAN-ODT) 4 MG disintegrating tablet  Every 8 hours PRN        07/18/23 0621    levETIRAcetam (KEPPRA) 500 MG tablet  2 times daily        07/18/23 0621              Curry Dulski, Eugene Gavia, PA-C 07/18/23 1610    Glynn Octave, MD 07/18/23 [phone]

## 2023-07-21 LAB — 10-HYDROXYCARBAZEPINE: Triliptal/MTB(Oxcarbazepin): 4 ug/mL — ABNORMAL LOW (ref 10–35)

## 2023-09-25 ENCOUNTER — Ambulatory Visit (INDEPENDENT_AMBULATORY_CARE_PROVIDER_SITE_OTHER): Payer: MEDICAID | Admitting: Podiatry

## 2023-09-25 ENCOUNTER — Encounter: Payer: Self-pay | Admitting: Podiatry

## 2023-09-25 DIAGNOSIS — B351 Tinea unguium: Secondary | ICD-10-CM | POA: Diagnosis not present

## 2023-09-25 DIAGNOSIS — Z79899 Other long term (current) drug therapy: Secondary | ICD-10-CM

## 2023-09-25 NOTE — Progress Notes (Signed)
  Subjective:  Patient ID: Roberto Manning, male    DOB: April 14, 1996,  MRN: [id number]  Chief Complaint  Patient presents with   Nail Problem    RM#13 Follow up on fungus treatment currently not on medication .   28 y.o. male returns for the above complaint.  Patient presents with thickened elongated dystrophic mycotic toenails x 10 mild pain on palpation of.  Patient would like to undergo second round of Lamisil .  The first previously did help.  Denies any other acute complaints  Objective:  There were no vitals filed for this visit. Podiatric Exam: Vascular: dorsalis pedis and posterior tibial pulses are palpable bilateral. Capillary return is immediate. Temperature gradient is WNL. Skin turgor WNL  Sensorium: Normal Semmes Weinstein monofilament test. Normal tactile sensation bilaterally. Nail Exam: Pt has thick disfigured discolored nails with subungual debris noted bilateral entire nail hallux through fifth toenails.  Pain on palpation to the nails.  Improving Ulcer Exam: There is no evidence of ulcer or pre-ulcerative changes or infection. Orthopedic Exam: Muscle tone and strength are WNL. No limitations in general ROM. No crepitus or effusions noted.  Skin: No Porokeratosis. No infection or ulcers    Assessment & Plan:   1. High risk medications (not anticoagulants) long-term use     Patient was evaluated and treated and all questions answered.  Onychomycosis toenails x 10.~Second round -Educated the patient on the etiology of onychomycosis and various treatment options associated with improving the fungal load.  I explained to the patient that there is 3 treatment options available to treat the onychomycosis including topical, p.o., laser treatment.  Patient elected to undergo p.o. options with Lamisil /terbinafine  therapy.  In order for me to start the medication therapy, I explained to the patient the importance of evaluating the liver and obtaining the liver function test.  Once  the liver function test comes back normal I will start him on 91-month course of Lamisil  therapy.  Patient understood all risk and would like to proceed with Lamisil  therapy.  I have asked the patient to immediately stop the Lamisil  therapy if she has any reactions to it and call the office or go to the emergency room right away.  Patient states understanding    No follow-ups on file.

## 2023-09-26 LAB — HEPATIC FUNCTION PANEL
ALT: 15 [IU]/L (ref 0–44)
AST: 22 [IU]/L (ref 0–40)
Albumin: 4.7 g/dL (ref 4.3–5.2)
Alkaline Phosphatase: 91 [IU]/L (ref 44–121)
Bilirubin Total: 0.2 mg/dL (ref 0.0–1.2)
Bilirubin, Direct: 0.1 mg/dL (ref 0.00–0.40)
Total Protein: 7.3 g/dL (ref 6.0–8.5)

## 2023-09-26 MED ORDER — TERBINAFINE HCL 250 MG PO TABS: 250.0000 mg | ORAL_TABLET | Freq: Every day | ORAL | 0 refills | Status: AC

## 2023-09-26 NOTE — Addendum Note (Signed)
 Addended by: Gabe Glace on: 09/26/2023 08:33 AM   Modules accepted: Orders

## 2023-10-02 ENCOUNTER — Encounter: Payer: Self-pay | Admitting: Physical Therapy

## 2023-12-26 ENCOUNTER — Other Ambulatory Visit: Payer: Self-pay | Admitting: Podiatry
